# Patient Record
Sex: Female | Born: 1990 | Race: Asian | Hispanic: No | Marital: Single | Smoking: Never smoker
Health system: Southern US, Community
[De-identification: ages and names within clinical notes are randomized; demographics above are authoritative.]

## PROBLEM LIST (undated history)

## (undated) DIAGNOSIS — L83 Acanthosis nigricans: Secondary | ICD-10-CM

## (undated) DIAGNOSIS — R569 Unspecified convulsions: Secondary | ICD-10-CM

## (undated) DIAGNOSIS — L906 Striae atrophicae: Secondary | ICD-10-CM

## (undated) DIAGNOSIS — R768 Other specified abnormal immunological findings in serum: Secondary | ICD-10-CM

## (undated) DIAGNOSIS — N920 Excessive and frequent menstruation with regular cycle: Secondary | ICD-10-CM

## (undated) DIAGNOSIS — S42409A Unspecified fracture of lower end of unspecified humerus, initial encounter for closed fracture: Secondary | ICD-10-CM

## (undated) DIAGNOSIS — G40909 Epilepsy, unspecified, not intractable, without status epilepticus: Secondary | ICD-10-CM

## (undated) DIAGNOSIS — L680 Hirsutism: Secondary | ICD-10-CM

## (undated) DIAGNOSIS — E162 Hypoglycemia, unspecified: Secondary | ICD-10-CM

## (undated) DIAGNOSIS — E161 Other hypoglycemia: Secondary | ICD-10-CM

## (undated) DIAGNOSIS — K219 Gastro-esophageal reflux disease without esophagitis: Secondary | ICD-10-CM

## (undated) DIAGNOSIS — L709 Acne, unspecified: Secondary | ICD-10-CM

## (undated) DIAGNOSIS — M419 Scoliosis, unspecified: Secondary | ICD-10-CM

## (undated) DIAGNOSIS — N912 Amenorrhea, unspecified: Secondary | ICD-10-CM

## (undated) DIAGNOSIS — J45909 Unspecified asthma, uncomplicated: Secondary | ICD-10-CM

## (undated) DIAGNOSIS — R000 Tachycardia, unspecified: Secondary | ICD-10-CM

## (undated) DIAGNOSIS — R6250 Unspecified lack of expected normal physiological development in childhood: Secondary | ICD-10-CM

## (undated) DIAGNOSIS — R040 Epistaxis: Secondary | ICD-10-CM

## (undated) HISTORY — DX: Acne, unspecified: L70.9

## (undated) HISTORY — DX: Unspecified asthma, uncomplicated: J45.909

## (undated) HISTORY — DX: Epistaxis: R04.0

## (undated) HISTORY — DX: Epilepsy, unspecified, not intractable, without status epilepticus: G40.909

## (undated) HISTORY — DX: Other hypoglycemia: E16.1

## (undated) HISTORY — DX: Hirsutism: L68.0

## (undated) HISTORY — DX: Gastro-esophageal reflux disease without esophagitis: K21.9

## (undated) HISTORY — DX: Unspecified lack of expected normal physiological development in childhood: R62.50

## (undated) HISTORY — DX: Amenorrhea, unspecified: N91.2

## (undated) HISTORY — DX: Tachycardia, unspecified: R00.0

## (undated) HISTORY — DX: Other specified abnormal immunological findings in serum: R76.8

## (undated) HISTORY — DX: Excessive and frequent menstruation with regular cycle: N92.0

## (undated) HISTORY — DX: Acanthosis nigricans: L83

## (undated) HISTORY — DX: Unspecified convulsions: R56.9

## (undated) HISTORY — DX: Scoliosis, unspecified: M41.9

## (undated) HISTORY — DX: Striae atrophicae: L90.6

## (undated) HISTORY — DX: Hypoglycemia, unspecified: E16.2

## (undated) HISTORY — DX: Unspecified fracture of lower end of unspecified humerus, initial encounter for closed fracture: S42.409A

---
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
## 2010-06-11 ENCOUNTER — Emergency Department (HOSPITAL_COMMUNITY): Admission: EM | Admit: 2010-06-11 | Discharge: 2010-06-11 | Payer: Self-pay | Admitting: Emergency Medicine

## 2011-04-23 ENCOUNTER — Ambulatory Visit: Payer: Self-pay | Admitting: Internal Medicine

## 2011-06-16 ENCOUNTER — Ambulatory Visit (INDEPENDENT_AMBULATORY_CARE_PROVIDER_SITE_OTHER): Payer: BC Managed Care – PPO | Admitting: Internal Medicine

## 2011-06-16 ENCOUNTER — Encounter: Payer: Self-pay | Admitting: Internal Medicine

## 2011-06-16 VITALS — BP 120/70 | HR 88 | Ht 65.0 in | Wt 145.0 lb

## 2011-06-16 DIAGNOSIS — R894 Abnormal immunological findings in specimens from other organs, systems and tissues: Secondary | ICD-10-CM

## 2011-06-16 DIAGNOSIS — R768 Other specified abnormal immunological findings in serum: Secondary | ICD-10-CM | POA: Insufficient documentation

## 2011-06-16 DIAGNOSIS — E161 Other hypoglycemia: Secondary | ICD-10-CM | POA: Insufficient documentation

## 2011-06-16 DIAGNOSIS — D649 Anemia, unspecified: Secondary | ICD-10-CM | POA: Insufficient documentation

## 2011-06-16 DIAGNOSIS — R7303 Prediabetes: Secondary | ICD-10-CM | POA: Insufficient documentation

## 2011-06-16 DIAGNOSIS — R5383 Other fatigue: Secondary | ICD-10-CM | POA: Insufficient documentation

## 2011-06-16 DIAGNOSIS — R5381 Other malaise: Secondary | ICD-10-CM

## 2011-06-16 DIAGNOSIS — E049 Nontoxic goiter, unspecified: Secondary | ICD-10-CM

## 2011-06-16 DIAGNOSIS — R000 Tachycardia, unspecified: Secondary | ICD-10-CM | POA: Insufficient documentation

## 2011-06-16 DIAGNOSIS — R6250 Unspecified lack of expected normal physiological development in childhood: Secondary | ICD-10-CM | POA: Insufficient documentation

## 2011-06-16 DIAGNOSIS — R7309 Other abnormal glucose: Secondary | ICD-10-CM

## 2011-06-16 DIAGNOSIS — L83 Acanthosis nigricans: Secondary | ICD-10-CM | POA: Insufficient documentation

## 2011-06-16 DIAGNOSIS — M4120 Other idiopathic scoliosis, site unspecified: Secondary | ICD-10-CM

## 2011-06-16 DIAGNOSIS — M419 Scoliosis, unspecified: Secondary | ICD-10-CM | POA: Insufficient documentation

## 2011-06-16 NOTE — Patient Instructions (Signed)
Lab in the am Then plan follow up depending on results.

## 2011-06-16 NOTE — Progress Notes (Signed)
Subjective:    Patient ID: Elizabeth Cantrell), female    DOB: 06-06-1991, 20 y.o.   MRN: [id number]  HPI The patient comes in today with mother for first time visit her previous care was through a retired endocrinology physician Dr. Richardo Hanks at [facility] an endocrinologist.  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 Had seizure at 4 months of age with negative metabolic workup responded to glucose. She was followed by Dr. Philippa Chester at [facility] yearly. She was on medication till about  Age 84 and and off since that time has had no more seizures. She has developmental delay and was getting OT PT and speech therapy at school but now she is doing better so they are only giving her speech therapy. She has limited cognitive ability but enjoys gaining and some music. She has some gait instability and some weakness in the upper extremities poor hand writing and fine motor skills.  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 She had amenorrhea until put on metformin. Was also noted to have a positive ANA but no rheumatologic disease diagnosed. She's had some fatigue in the last year and her previous physician noted a low iron level with normal hemoglobin MCV of 74 iron saturation of 6 of her tests have been normal and reportedly normal thyroid tests.  At this point in time she appears to be clinically stable and developmentally stable according to her mother. She does tend to have frequent falling and has some history of fracture but I do not have those records.   Review of Systems Negative chest pain shortness of breath hearing loss question some vision issues no vomiting blood in stool has about 3 or 4 periods a year. Her acne and body hair  is apparently better in the last few years. No increased weight gain mom states she has a poor appetite.  Sleep is about 6 hours a night has always had restless sleep. No history of snoring obstructive sleep apnea.   She has a history of an elevated heart rate when she walks but cardiology at [facility] felt this was secondary to deconditioning. rset orf ros as per hpi or neg  Past history family history social history reviewed   entered in the electronic medical record.  Positive for Ht Diabetes and elevated lipids    Objective:   Physical Exam WDWN microcephalic in nad cooperative adn resopnsive with  shrot sentence speech.  Eyes PERRLA EOMs difficult to assess ears EACs normal TMs patent normal landmarks Nares patent no discharge OP tonsils +1+2 but good airway no edema Neck supple thyroid is easily palpable but no nodules or tenderness no adenopathy Chest:  Clear to A&P without wheezes rales or rhonchi CV:  S1-S2 no gallops or murmurs peripheral perfusion is normal pulses are intact Abdomen:  Sof,t normal bowel sounds without hepatosplenomegaly, no guarding rebound or masses no CVA tenderness Skin: Small amount of acne on face no significant darkening of the back of her neck. Usual stria abdomen. Neurologic nonfocal weakness hand grip use of upper extremities her gait is somewhat floppy and unsteady and she hyperextends abit .  No focal joint redness or swelling. Hyperreflexic with tight heel cords. 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 Mild goiter felt on exam today.   We'll check lipids A1c blood sugar thyroid half and chemistries and antibodies and a fasting sample and then plan followup will also add a  B12 level and a ferritin as supplementation may BE helpful in her fatigue I don't see any evidence of sleep apnea on multiple questioning..    We will follow her up and she will be followed yearly at [facility] from the neurologic standpoint.

## 2011-06-16 NOTE — Assessment & Plan Note (Signed)
Apparently stable disorder .  Gait distubance and some falling  Hx of seizure as infant with neg metabolic workup.

## 2011-06-17 ENCOUNTER — Other Ambulatory Visit: Payer: BC Managed Care – PPO

## 2011-06-17 LAB — CBC WITH DIFFERENTIAL/PLATELET
Basophils Relative: 0.4 % (ref 0.0–3.0)
Eosinophils Absolute: 0.1 10*3/uL (ref 0.0–0.7)
Hemoglobin: 13.8 g/dL (ref 12.0–15.0)
Lymphocytes Relative: 32.5 % (ref 12.0–46.0)
Lymphs Abs: 3.5 10*3/uL (ref 0.7–4.0)
MCHC: 33.6 g/dL (ref 30.0–36.0)
Neutro Abs: 6.5 10*3/uL (ref 1.4–7.7)
Neutrophils Relative %: 60.8 % (ref 43.0–77.0)
Platelets: 254 10*3/uL (ref 150.0–400.0)

## 2011-06-17 LAB — TSH: TSH: 1.62 u[IU]/mL (ref 0.35–5.50)

## 2011-06-17 LAB — LIPID PANEL
LDL Cholesterol: 44 mg/dL (ref 0–99)
Total CHOL/HDL Ratio: 2

## 2011-06-17 LAB — BASIC METABOLIC PANEL
Creatinine, Ser: 0.6 mg/dL (ref 0.4–1.2)
Glucose, Bld: 81 mg/dL (ref 70–99)
Potassium: 3.8 mEq/L (ref 3.5–5.1)

## 2011-06-17 LAB — VITAMIN B12: Vitamin B-12: 452 pg/mL (ref 211–911)

## 2011-06-17 LAB — T4, FREE: Free T4: 1.01 ng/dL (ref 0.60–1.60)

## 2011-06-18 LAB — THYROID PEROXIDASE ANTIBODY: Thyroperoxidase Ab SerPl-aCnc: 38.5 IU/mL — ABNORMAL HIGH (ref <35.0)

## 2011-06-29 ENCOUNTER — Telehealth: Payer: Self-pay | Admitting: *Deleted

## 2011-06-29 NOTE — Telephone Encounter (Signed)
Left message to call back  

## 2011-06-29 NOTE — Telephone Encounter (Signed)
Message copied by Romualdo Bolk on Tue Jun 29, 2011 11:56 AM ------      Message from: [facility], [place] K      Created: Mon Jun 28, 2011  4:49 PM       Advise mom that labs show borderline low iron levels and normal functioning thyroid but borderline thyroid antibodies present.        This means that her thyroid could begin to function abnormally in future years but is ok now. Will follow labs at least yearly.        Her blood sugar is normal      She may want to take extra iron and see if fatigue is helped .      Recheck  In  4- 6 months to see how she is doing or as needed.

## 2011-07-04 ENCOUNTER — Encounter: Payer: Self-pay | Admitting: Internal Medicine

## 2011-07-04 DIAGNOSIS — E049 Nontoxic goiter, unspecified: Secondary | ICD-10-CM | POA: Insufficient documentation

## 2011-07-04 NOTE — Assessment & Plan Note (Signed)
Get labs r/o dysfunction

## 2011-07-04 NOTE — Assessment & Plan Note (Signed)
Stable seen peds endo at [facility]

## 2011-07-05 NOTE — Telephone Encounter (Signed)
Left message to call back  

## 2011-07-07 NOTE — Telephone Encounter (Signed)
Left message to call back  

## 2011-07-08 NOTE — Telephone Encounter (Signed)
Pts mom aware of results

## 2011-07-22 ENCOUNTER — Telehealth: Payer: Self-pay | Admitting: *Deleted

## 2011-07-22 DIAGNOSIS — M419 Scoliosis, unspecified: Secondary | ICD-10-CM

## 2011-07-22 DIAGNOSIS — D649 Anemia, unspecified: Secondary | ICD-10-CM

## 2011-07-22 DIAGNOSIS — R000 Tachycardia, unspecified: Secondary | ICD-10-CM

## 2011-07-22 DIAGNOSIS — E049 Nontoxic goiter, unspecified: Secondary | ICD-10-CM

## 2011-07-22 DIAGNOSIS — R7303 Prediabetes: Secondary | ICD-10-CM

## 2011-07-22 DIAGNOSIS — R6250 Unspecified lack of expected normal physiological development in childhood: Secondary | ICD-10-CM

## 2011-07-22 DIAGNOSIS — R768 Other specified abnormal immunological findings in serum: Secondary | ICD-10-CM

## 2011-07-22 DIAGNOSIS — R5383 Other fatigue: Secondary | ICD-10-CM

## 2011-07-22 DIAGNOSIS — L83 Acanthosis nigricans: Secondary | ICD-10-CM

## 2011-07-22 MED ORDER — METFORMIN HCL ER 750 MG PO TB24: 750.0000 mg | ORAL_TABLET | Freq: Every day | ORAL | Status: DC

## 2011-07-22 NOTE — Telephone Encounter (Signed)
rx sent to pharmacy

## 2011-08-18 IMAGING — CR DG ELBOW COMPLETE 3+V*L*
5 series · 5 of 5 positions shown · non-contrast
Comparison: None.

CLINICAL DATA: Status post fall; posterior left elbow pain.

LEFT ELBOW - COMPLETE 3+ VIEW

[x elbow joint ap left]
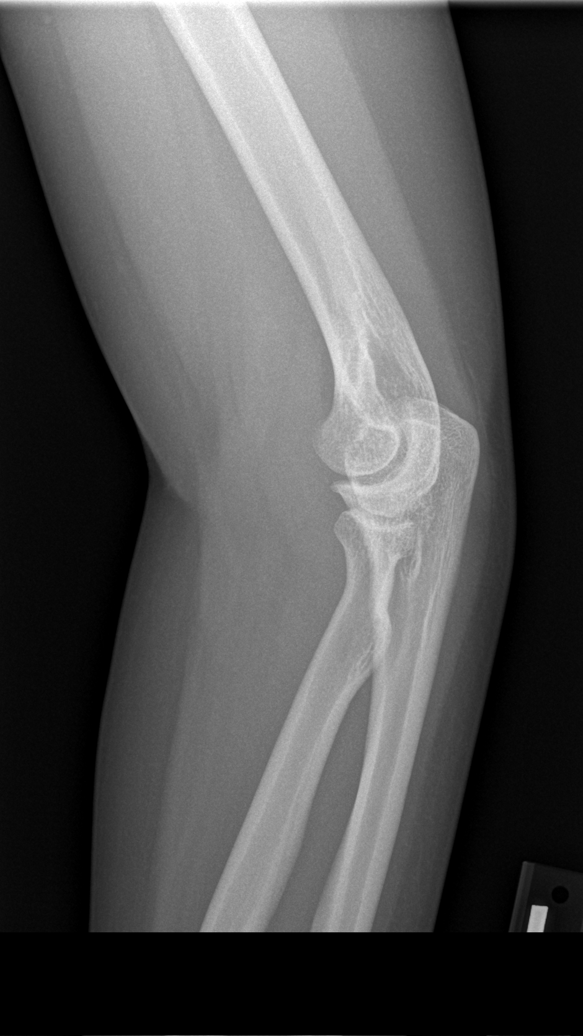

[x elbow joint obl. left (1 of 2)]
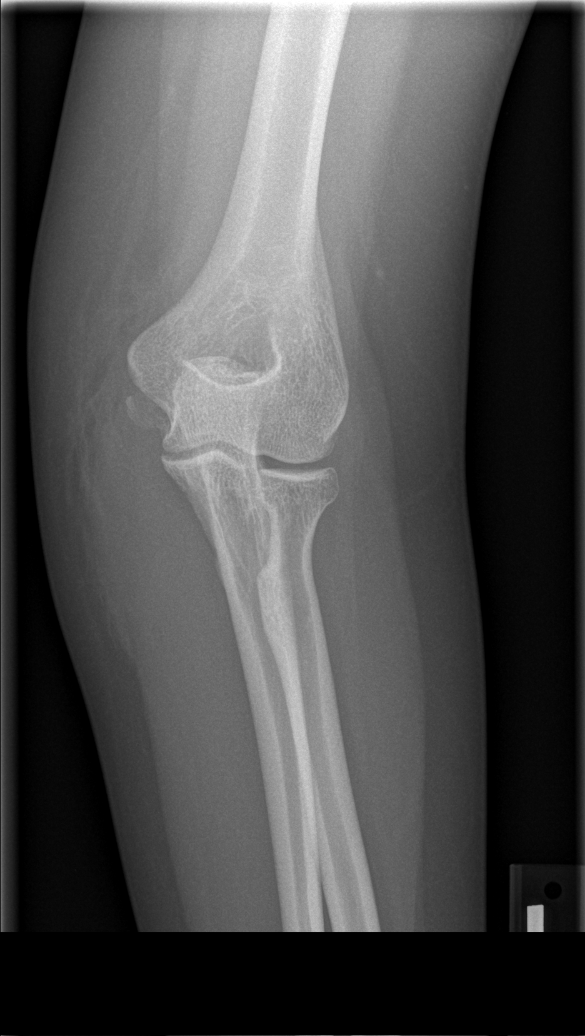

[x elbow joint obl. left (2 of 2)]
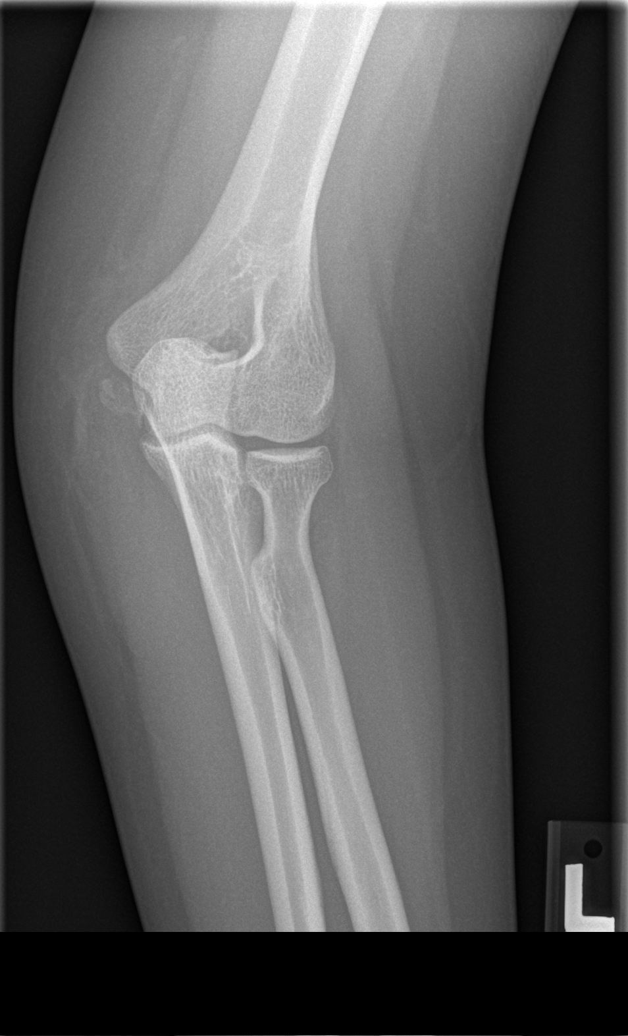

[x elbow joint lat left (1 of 2)]
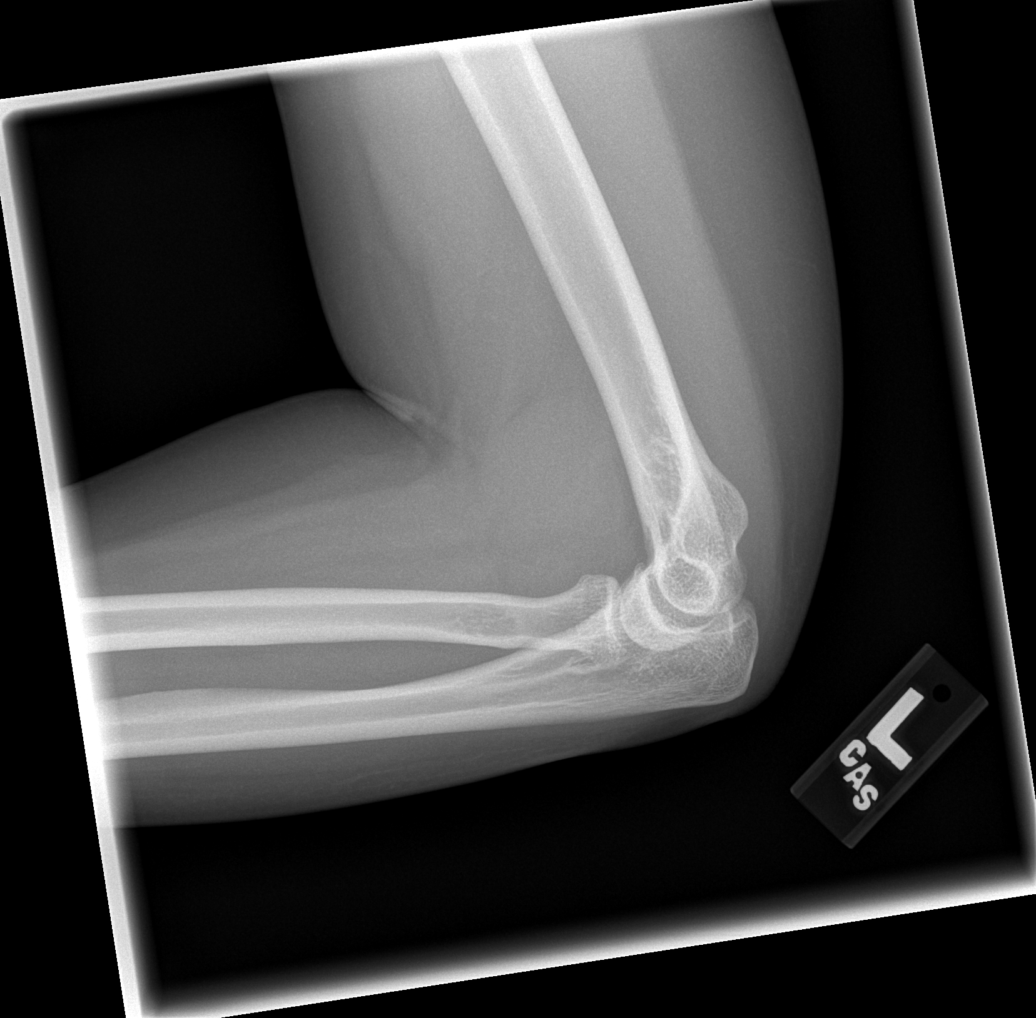

[x elbow joint lat left (2 of 2)]
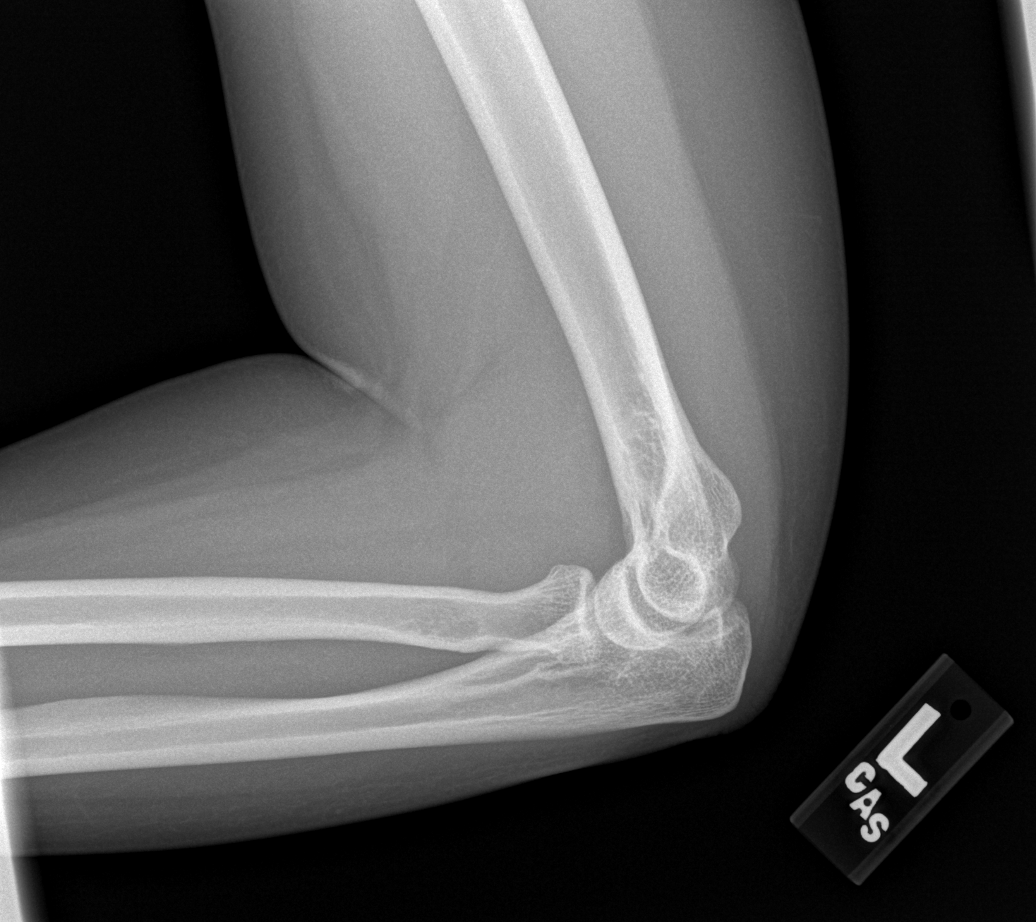

[5 of 5 positions shown; findings below may reference images not displayed]

FINDINGS: An amorphous osseous fragment just distal to the lateral
epicondyle reflects avulsion injury of indeterminate age; this
could be an acute fracture.  The visualized joint spaces are
preserved.  No significant joint effusion is identified.  The soft
tissues are unremarkable in appearance.
IMPRESSION: Avulsion injury from the lateral epicondyle, of indeterminate age;
this could reflect an acute fracture.

## 2012-01-05 ENCOUNTER — Encounter: Payer: Self-pay | Admitting: Internal Medicine

## 2012-01-05 ENCOUNTER — Ambulatory Visit (INDEPENDENT_AMBULATORY_CARE_PROVIDER_SITE_OTHER): Payer: BC Managed Care – PPO | Admitting: Internal Medicine

## 2012-01-05 VITALS — BP 120/80 | HR 84 | Wt 153.0 lb

## 2012-01-05 DIAGNOSIS — R5381 Other malaise: Secondary | ICD-10-CM

## 2012-01-05 DIAGNOSIS — R7309 Other abnormal glucose: Secondary | ICD-10-CM

## 2012-01-05 DIAGNOSIS — L659 Nonscarring hair loss, unspecified: Secondary | ICD-10-CM

## 2012-01-05 DIAGNOSIS — L708 Other acne: Secondary | ICD-10-CM

## 2012-01-05 DIAGNOSIS — R7303 Prediabetes: Secondary | ICD-10-CM

## 2012-01-05 DIAGNOSIS — R5383 Other fatigue: Secondary | ICD-10-CM

## 2012-01-05 DIAGNOSIS — L709 Acne, unspecified: Secondary | ICD-10-CM

## 2012-01-05 DIAGNOSIS — R6250 Unspecified lack of expected normal physiological development in childhood: Secondary | ICD-10-CM

## 2012-01-05 DIAGNOSIS — L83 Acanthosis nigricans: Secondary | ICD-10-CM

## 2012-01-05 NOTE — Patient Instructions (Addendum)
Continue metformin  As you are doing.   exercise as tolerated.   Full set of fasting labs in 6 months and then Wellness check   See dermatologist  Regarding acne  And hair loss on scalp.

## 2012-01-09 DIAGNOSIS — L659 Nonscarring hair loss, unspecified: Secondary | ICD-10-CM | POA: Insufficient documentation

## 2012-01-09 DIAGNOSIS — L709 Acne, unspecified: Secondary | ICD-10-CM | POA: Insufficient documentation

## 2012-01-09 NOTE — Progress Notes (Signed)
  Subjective:    Patient ID: Elizabeth Cantrell), female    DOB: Mar 11, 1991, 21 y.o.   MRN: [id number]  HPI Patient comes in today for follow up of  multiple medical problems.  With mom today No major change in health status since last visit . Doing ok   NEURO may have had a partial seizure left side of body shaking   Alert no LOC  Not on med for seizure at this point. Acne ? What tot do on face . Has creams at home but not using them .  Scalp is also thinning  Top of head  Periods  Ok  Pre diabetes  No sig weight change  Monitoring  Sugars and have been ok.  On metformin without se   .chornic fatigue and ? Exercise intolerance  Where hr elevated's no change felt ot be from deconditioning when evaluated in the past.  No injury or hospitalizations   Review of Systems Neg cp sob bleeding weight changes   Falling    Rest as per hpi   Past history family history social history reviewed in the electronic medical record. Outpatient Prescriptions Prior to Visit  Medication Sig Dispense Refill  . metFORMIN (GLUCOPHAGE-XR) 750 MG 24 hr tablet Take 1 tablet (750 mg total) by mouth daily with breakfast.  30 tablet  5        Objective:   Physical Exam Wd alert in nad cooperative  HEENT  tms clear op tongue midline eoms seem ok  Skin: normal capillary refill ,turgor , color: No acute rashes ,petechiae or bruising  But has scattered acne papules on face  No cystic lesions and some on upper back .   Scalp clear but top of head with thinning hair.  No excess body hair and no stria noted  Neck: Supple without adenopathy or masses or bruits Thyroid palpable non tender Chest:  Clear to A&P without wheezes rales or rhonchi CV:  S1-S2 no gallops or murmurs peripheral perfusion is normal RR rate 82- 88   No clubbing cyanosis or edema Gait ok     Assessment & Plan:  Insulin resistance and pre diabetes  With normal sugars ( check by mom who is diabetic)  Labs had been ok last check    Ok to refill med  today and plan for afull set of labs in 6 months with wellness visit  Acne  adherence to med is an issue so can decide to use or not .Marland Kitchen    Hair thinning may be genetic no androgenic features  Derm may want to get labs if needed  Neuro   Check with  Neuro about  Hx of poss seizure recurrence    No other change in health status

## 2012-01-24 ENCOUNTER — Other Ambulatory Visit: Payer: Self-pay | Admitting: Internal Medicine

## 2012-04-24 ENCOUNTER — Encounter: Payer: Self-pay | Admitting: Internal Medicine

## 2012-04-24 ENCOUNTER — Ambulatory Visit (INDEPENDENT_AMBULATORY_CARE_PROVIDER_SITE_OTHER): Payer: BC Managed Care – PPO | Admitting: Internal Medicine

## 2012-04-24 VITALS — BP 116/76 | HR 90 | Temp 98.0°F | Wt 149.0 lb

## 2012-04-24 DIAGNOSIS — E049 Nontoxic goiter, unspecified: Secondary | ICD-10-CM

## 2012-04-24 DIAGNOSIS — L83 Acanthosis nigricans: Secondary | ICD-10-CM

## 2012-04-24 DIAGNOSIS — R7303 Prediabetes: Secondary | ICD-10-CM

## 2012-04-24 DIAGNOSIS — L659 Nonscarring hair loss, unspecified: Secondary | ICD-10-CM

## 2012-04-24 DIAGNOSIS — R894 Abnormal immunological findings in specimens from other organs, systems and tissues: Secondary | ICD-10-CM

## 2012-04-24 DIAGNOSIS — R6250 Unspecified lack of expected normal physiological development in childhood: Secondary | ICD-10-CM

## 2012-04-24 DIAGNOSIS — R7309 Other abnormal glucose: Secondary | ICD-10-CM

## 2012-04-24 DIAGNOSIS — R768 Other specified abnormal immunological findings in serum: Secondary | ICD-10-CM

## 2012-04-24 DIAGNOSIS — R569 Unspecified convulsions: Secondary | ICD-10-CM

## 2012-04-24 NOTE — Patient Instructions (Addendum)
Plan fasting labs ( can have water) Then we con arrange a endocrinology and neurology   Evaluation .    The hair problem   could be from excess female hormone seen in PCO but check for other causes such as thyroid again.

## 2012-04-28 ENCOUNTER — Other Ambulatory Visit (INDEPENDENT_AMBULATORY_CARE_PROVIDER_SITE_OTHER): Payer: BC Managed Care – PPO

## 2012-04-28 DIAGNOSIS — R768 Other specified abnormal immunological findings in serum: Secondary | ICD-10-CM

## 2012-04-28 DIAGNOSIS — R6250 Unspecified lack of expected normal physiological development in childhood: Secondary | ICD-10-CM

## 2012-04-28 DIAGNOSIS — R894 Abnormal immunological findings in specimens from other organs, systems and tissues: Secondary | ICD-10-CM

## 2012-04-28 DIAGNOSIS — L659 Nonscarring hair loss, unspecified: Secondary | ICD-10-CM

## 2012-04-28 DIAGNOSIS — L83 Acanthosis nigricans: Secondary | ICD-10-CM

## 2012-04-28 DIAGNOSIS — Z0000 Encounter for general adult medical examination without abnormal findings: Secondary | ICD-10-CM

## 2012-04-28 DIAGNOSIS — R7309 Other abnormal glucose: Secondary | ICD-10-CM

## 2012-04-28 DIAGNOSIS — R7303 Prediabetes: Secondary | ICD-10-CM

## 2012-04-28 LAB — CBC WITH DIFFERENTIAL/PLATELET
Basophils Absolute: 0.1 10*3/uL (ref 0.0–0.1)
Hemoglobin: 14.4 g/dL (ref 12.0–15.0)
MCHC: 34.1 g/dL (ref 30.0–36.0)
MCV: 86.5 fl (ref 78.0–100.0)
Neutrophils Relative %: 58 % (ref 43.0–77.0)
RBC: 4.87 Mil/uL (ref 3.87–5.11)
RDW: 15.7 % — ABNORMAL HIGH (ref 11.5–14.6)
WBC: 11.2 10*3/uL — ABNORMAL HIGH (ref 4.5–10.5)

## 2012-04-28 LAB — BASIC METABOLIC PANEL
Creatinine, Ser: 0.7 mg/dL (ref 0.4–1.2)
Potassium: 4 mEq/L (ref 3.5–5.1)
Sodium: 138 mEq/L (ref 135–145)

## 2012-04-28 LAB — HEPATIC FUNCTION PANEL
ALT: 20 U/L (ref 0–35)
Albumin: 3.9 g/dL (ref 3.5–5.2)
Bilirubin, Direct: 0 mg/dL (ref 0.0–0.3)
Total Bilirubin: 0.9 mg/dL (ref 0.3–1.2)
Total Protein: 8.2 g/dL (ref 6.0–8.3)

## 2012-04-28 LAB — LIPID PANEL
Cholesterol: 128 mg/dL (ref 0–200)
HDL: 49 mg/dL (ref >39.00)

## 2012-04-28 LAB — TESTOSTERONE: Testosterone: 48.91 ng/dL (ref 10.00–70.00)

## 2012-04-29 LAB — INSULIN, FASTING: Insulin fasting, serum: 33 u[IU]/mL — ABNORMAL HIGH (ref 3–28)

## 2012-04-30 ENCOUNTER — Encounter: Payer: Self-pay | Admitting: Internal Medicine

## 2012-04-30 NOTE — Progress Notes (Signed)
  Subjective:    Patient ID: Elizabeth Cantrell), female    DOB: 1991-01-06, 21 y.o.   MRN: [id number]  HPI Patient comes in today for follow up of  multiple medical problems.  With mom  Never got labs done  .  Has no local neuro jsut het [facility] eval  ? Cause  Sx with exercise they call weakness and seizure but unsure what this is. Concerning  about hair progressing. Still taking metformin. No se noted .    Review of Systems Neg fever cp sob cough bleeding  Periods "ok"  Hx of elevated HR and poor exercise tolerance.   Past history family history social history reviewed in the electronic medical record. Mom had type 2 dm and hepatic steatosis Outpatient Encounter Prescriptions as of 04/24/2012  Medication Sig Dispense Refill  . metFORMIN (GLUCOPHAGE-XR) 750 MG 24 hr tablet TAKE 1 TABLET BY MOUTH DAILY WITH BREAKFAST  30 tablet  5       Objective:   Physical Exam BP 116/76  Pulse 90  Temp 98 F (36.7 C) (Oral)  Wt 149 lb (67.586 kg)  SpO2 98%  LMP 02/23/2012 WDWN in nad  Interactive  Scalp thinning  Mostly frontal and top of head no scarring alopecia . No excessive body hair  Faint pigment under axilla  No stria. Neck: Supple without adenopathy or masses or bruits thyroid ? Palpable  Chest:  Clear to A&P without wheezes rales or rhonchi CV:  S1-S2 no gallops or murmurs peripheral perfusion is normal Abdomen:  Sof,t normal bowel sounds without hepatosplenomegaly, no guarding rebound or masses no CVA tenderness Neuro cooperative  No tremor      Assessment & Plan:  Hair thinning never had labs done so will arrange this and include testosterone, ana with hx of borderline positive in the past. Didn't see derm ?  See last note.   "seizure like episodes or weakness with some exercise  Evaluated at [facility] ? Cause  dont have these records today. There are 2 notes from PEDs endo 2011     See if we can get local neuro to see with peds neuro such as Dr Sharene Skeans to see her .

## 2012-05-01 LAB — DHEA-SULFATE: DHEA-SO4: 152 ug/dL (ref 35–430)

## 2012-05-01 LAB — ANTI-NUCLEAR AB-TITER (ANA TITER): ANA Titer 1: NEGATIVE

## 2012-05-01 LAB — ANA: Anti Nuclear Antibody(ANA): POSITIVE — AB

## 2012-05-09 ENCOUNTER — Other Ambulatory Visit: Payer: Self-pay | Admitting: Family Medicine

## 2012-05-09 DIAGNOSIS — L659 Nonscarring hair loss, unspecified: Secondary | ICD-10-CM

## 2012-05-17 ENCOUNTER — Other Ambulatory Visit: Payer: Self-pay | Admitting: Family Medicine

## 2012-05-17 DIAGNOSIS — R569 Unspecified convulsions: Secondary | ICD-10-CM

## 2012-05-18 NOTE — Progress Notes (Signed)
Left message on voicemail for the pt to return my call. 

## 2012-08-23 ENCOUNTER — Other Ambulatory Visit: Payer: Self-pay | Admitting: Internal Medicine

## 2012-08-25 ENCOUNTER — Encounter: Payer: Self-pay | Admitting: Internal Medicine

## 2012-09-26 ENCOUNTER — Other Ambulatory Visit: Payer: Self-pay | Admitting: Internal Medicine

## 2013-04-09 ENCOUNTER — Other Ambulatory Visit: Payer: Self-pay | Admitting: Internal Medicine

## 2013-05-30 ENCOUNTER — Encounter: Payer: Self-pay | Admitting: Internal Medicine

## 2013-05-30 ENCOUNTER — Ambulatory Visit (INDEPENDENT_AMBULATORY_CARE_PROVIDER_SITE_OTHER): Payer: BC Managed Care – PPO | Admitting: Internal Medicine

## 2013-05-30 VITALS — BP 100/70 | HR 84 | Temp 97.4°F | Ht 65.0 in | Wt 151.0 lb

## 2013-05-30 DIAGNOSIS — R6250 Unspecified lack of expected normal physiological development in childhood: Secondary | ICD-10-CM

## 2013-05-30 DIAGNOSIS — Z23 Encounter for immunization: Secondary | ICD-10-CM

## 2013-05-30 DIAGNOSIS — R7309 Other abnormal glucose: Secondary | ICD-10-CM

## 2013-05-30 DIAGNOSIS — E611 Iron deficiency: Secondary | ICD-10-CM | POA: Insufficient documentation

## 2013-05-30 DIAGNOSIS — R7303 Prediabetes: Secondary | ICD-10-CM

## 2013-05-30 DIAGNOSIS — Z0000 Encounter for general adult medical examination without abnormal findings: Secondary | ICD-10-CM | POA: Insufficient documentation

## 2013-05-30 DIAGNOSIS — L659 Nonscarring hair loss, unspecified: Secondary | ICD-10-CM

## 2013-05-30 DIAGNOSIS — D509 Iron deficiency anemia, unspecified: Secondary | ICD-10-CM

## 2013-05-30 LAB — CBC WITH DIFFERENTIAL/PLATELET
Eosinophils Relative: 0.6 % (ref 0.0–5.0)
HCT: 44.2 % (ref 36.0–46.0)
Hemoglobin: 14.6 g/dL (ref 12.0–15.0)
Lymphs Abs: 2.8 10*3/uL (ref 0.7–4.0)
Monocytes Relative: 6.6 % (ref 3.0–12.0)
Platelets: 278 10*3/uL (ref 150.0–400.0)
WBC: 10.4 10*3/uL (ref 4.5–10.5)

## 2013-05-30 LAB — BASIC METABOLIC PANEL
BUN: 10 mg/dL (ref 6–23)
GFR: 125.24 mL/min (ref >60.00)
Potassium: 4.1 mEq/L (ref 3.5–5.1)
Sodium: 136 mEq/L (ref 135–145)

## 2013-05-30 LAB — LIPID PANEL
Cholesterol: 125 mg/dL (ref 0–200)
HDL: 40.3 mg/dL (ref >39.00)
Triglycerides: 202 mg/dL — ABNORMAL HIGH (ref 0.0–149.0)
VLDL: 40.4 mg/dL — ABNORMAL HIGH (ref 0.0–40.0)

## 2013-05-30 LAB — T4, FREE: Free T4: 1.02 ng/dL (ref 0.60–1.60)

## 2013-05-30 LAB — HEMOGLOBIN A1C: Hgb A1c MFr Bld: 4.1 % — ABNORMAL LOW (ref 4.6–6.5)

## 2013-05-30 LAB — TSH: TSH: 0.29 u[IU]/mL — ABNORMAL LOW (ref 0.35–5.50)

## 2013-05-30 NOTE — Progress Notes (Signed)
Chief Complaint  Patient presents with  . Annual Exam    here with mom     HPI: Patient comes in today for Preventive Health Care visit  Since her last visit she has done well she seen a neurologist he states that her seizures are resolved and does need to go back except as needed Also has seen the endocrinologist and the dermatologist for thinning hair lab tests apparently were okay except for slightly lowered iron level. No recommendations. Only medication is metformin. At this time here seems to be stable. Her periods  better about every 40 days. Mom think she's doing better  She goes to school and get some exercise with walking morning and evening at home. She complained of some ringing in her ears to mom looked in there and thought she had a hair in her ear or water. No hearing deficit concerns no falling or dizziness ROS:  GEN/ HEENT: No fever, significant weight changes sweats headaches vision problems hearing changes, CV/ PULM; No chest pain shortness of breath cough, syncope,edema  change in exercise tolerance. GI /GU: No adominal pain, vomiting, change in bowel habits. No blood in the stool. No significant GU symptoms. SKIN/HEME: ,no acute skin rashes suspicious lesions or bleeding. No lymphadenopathy, nodules, masses.  NEURO/ PSYCH:  No neurologic signs such as weakness numbness. No depression anxiety. IMM/ Allergy: No unusual infections.  Allergy .   REST of 12 system review negative except as per HPI   Past Medical History  Diagnosis Date  . Seizure disorder   . Acquired acanthosis nigricans   . Hyperinsulinism   . 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 Testing for Rett Syndrome normal.  . Hypoglycemia     with seizures that seem to respond to glucose  . Hirsutism     with elevated free testosterone levels  . Acne   . Striae   . Tachycardia   . Heavy periods   . Nosebleed     with abnormal clotting  studies  . Amenorrhea     until beginning glucophage  . ANA positive   . 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 Fractured elbow     x 2 with falls    Family History  Problem Relation Age of Onset  . Diabetes Mother   . Hypertension Mother   . Hyperlipidemia      parent grandparent  History reviewed. No pertinent past surgical history.   History   Social History  . Marital Status: Married    Spouse Name: N/A    Number of Children: N/A  . Years of Education: N/A   Social History Main Topics  . Smoking status: Never Smoker   . Smokeless tobacco: None  . Alcohol Use: No  . Drug Use: No  . Sexually Active: None   Other Topics Concern  . None   Social History Narrative   hh of  3   [employer]  [name] gets    Neg tad   6 hours sleep gets speech therapy at school ( no ot pt)    Outpatient Encounter Prescriptions as of 05/30/2013  Medication Sig Dispense Refill  . metFORMIN (GLUCOPHAGE-XR) 750 MG 24 hr tablet TAKE 1 TABLET BY MOUTH DAILY WITH BREAKFAST  30 tablet  5  . metFORMIN (GLUCOPHAGE-XR) 750 MG 24 hr tablet TAKE 1 TABLET BY MOUTH  DAILY WITH BREAKFAST  90 tablet  0   No facility-administered encounter medications on file as of 05/30/2013.    EXAM:  BP 100/70  Pulse 84  Temp(Src) 97.4 F (36.3 C) (Oral)  Ht 5\' 5"  (1.651 m)  Wt 151 lb (68.493 kg)  BMI 25.13 kg/m2  SpO2 98%  LMP 04/25/2013  Body mass index is 25.13 kg/(m^2).  Physical Exam: Vital signs reviewed ZOX:WRUE is a well-developed well-nourished alert cooperative   female who appears her stated age in no acute distress. Here with mom cooperative HEENT: normocephalic atraumatic , Eyes: PERRL EOM's are to elicit and follow conjunctiva clear, Nares: paten,t no deformity discharge or tenderness., Ears: no deformity EAC's clear TMs with normal landmarks. There is a small amount of wax in both ears but I don't see a foreign body. Mouth: clear  OP, no lesions, edema.  Moist mucous membranes. Dentition in adequate repair. NECK: supple without masses,or bruits. No adenopathy CHEST/PULM:  Clear to auscultation and percussion breath sounds equal no wheeze , rales or rhonchi. No chest wall deformities or tenderness. Breast: normal by inspection . No dimpling, discharge, masses, tenderness or discharge . Tanner 5 CV: PMI is nondisplaced, S1 S2 no gallops, murmurs, rubs. Peripheral pulses are full without delay.No JVD .  ABDOMEN: Bowel sounds normal nontender  No guard or rebound, no hepato splenomegal no CVA tenderness.  No hernia. Extremtities:  No clubbing cyanosis or edema, no acute joint swelling or redness no focal atrophy NEURO:  Oriented x3, cranial nerves 3-12 appear to be intact, no obvious focal weakness,gait within normal limit brisk lower extremity reflexes slight distal atrophy? SKIN: No acute rashes normal turgor, color, no bruising or petechiae. They did acne on the back 2 areas of scars mom says sounds like a keloid. Plan is to observe   Hair thinnin on top of head.  Midline hair lower abdomen  No acute striae LN: no cervical axillary inguinal adenopathy Screening ortho / MS exam: normal; mild  scoliosis ,LOM , joint swelling or gait disturbance . ?  Lab Results  Component Value Date   WBC 11.2* 04/28/2012   HGB 14.4 04/28/2012   HCT 42.1 04/28/2012   PLT 253.0 04/28/2012   GLUCOSE 85 04/28/2012   CHOL 128 04/28/2012   TRIG 75.0 04/28/2012   HDL 49.00 04/28/2012   LDLCALC 64 04/28/2012   ALT 20 04/28/2012   AST 12 04/28/2012   NA 138 04/28/2012   K 4.0 04/28/2012   CL 105 04/28/2012   CREATININE 0.7 04/28/2012   BUN 8 04/28/2012   CO2 25 04/28/2012   TSH 3.35 04/28/2012   HGBA1C 4.1* 04/28/2012    ASSESSMENT AND PLAN:  Discussed the following assessment and plan:  Visit for preventive health examination - Plan: Basic metabolic panel, CBC with Differential, Hemoglobin A1c, Lipid panel, TSH, T4, free, Ferritin, Tdap vaccine greater than or equal  to 7yo IM  Prediabetes insulin resistance  - good tolerance of meds  - Plan: Basic metabolic panel, CBC with Differential, Hemoglobin A1c, Lipid panel, TSH, T4, free, Ferritin  Hair thinning - top of head   no progressino of body hair  - Plan: Basic metabolic panel, CBC with Differential, Hemoglobin A1c, Lipid panel, TSH, T4, free, Ferritin  Iron deficiency - Plan: Basic metabolic panel, CBC with Differential, Hemoglobin A1c, Lipid panel, TSH, T4, free, Ferritin  Need for prophylactic vaccination with combined diphtheria-tetanus-pertussis (DTP) vaccine - Plan: Tdap vaccine greater than or equal to 7yo IM  Developmental delay disorder  Question of bringing in ears comes and goes try wax softening drops I do not see impacted wax we could hear date if persistent. Discussed with mom her metabolic tendency. She may fit the criteria for PCO OS or metabolic syndrome as it used to be called. She has a history of some insulin resistance has some elevated testosterone she may benefit for hormonal therapy such as OCPs. Mom is unsure she's interested in this and at this time. She's got reminders that her child to be getting a Pap smear however mom says she is virginal and doesn't really think she needs a Pap. I agree but a gynecologic consult possible pelvic exam may be helpful in management. We'll think about it consider next year. Check routine labs today to include her glucose and lipids Patient Care Team: Madelin Headings, MD as PCP - General (Internal Medicine) Talmage Coin, MD as Attending Physician (Endocrinology) Patient Instructions  Will notify you  of labs when available. Encourage  Exercise and acitivy as you are doing and healthy diet to prevent diabetes.  I agree  Pap test may net need to be done  but consider getting  Adequate GYNE pelvic exam  With her hx of irreg menses and insulin resistance . Sometimes  Hormonal therapy ie ( OCPS) is also helpful .   We can do this next year or if  decide in meantime .    Try debrox or murine softening drop sin ear at night and see if this helps her sx.   ROV in  12 months or as needed     Neta Mends. Audwin Semper M.D.  Health Maintenance  Topic Date Due  . Pap Smear  02/06/2009  . Influenza Vaccine  06/25/2013  . Tetanus/tdap  05/31/2023   Health Maintenance Review

## 2013-05-30 NOTE — Patient Instructions (Signed)
Will notify you  of labs when available. Encourage  Exercise and acitivy as you are doing and healthy diet to prevent diabetes.  I agree  Pap test may net need to be done  but consider getting  Adequate GYNE pelvic exam  With her hx of irreg menses and insulin resistance . Sometimes  Hormonal therapy ie ( OCPS) is also helpful .   We can do this next year or if decide in meantime .    Try debrox or murine softening drop sin ear at night and see if this helps her sx.   ROV in  12 months or as needed

## 2013-05-31 LAB — LDL CHOLESTEROL, DIRECT: Direct LDL: 66.8 mg/dL

## 2013-06-15 ENCOUNTER — Encounter: Payer: Self-pay | Admitting: Internal Medicine

## 2013-07-09 ENCOUNTER — Other Ambulatory Visit: Payer: Self-pay | Admitting: Internal Medicine

## 2013-07-16 ENCOUNTER — Other Ambulatory Visit (INDEPENDENT_AMBULATORY_CARE_PROVIDER_SITE_OTHER): Payer: BC Managed Care – PPO

## 2013-07-16 DIAGNOSIS — E039 Hypothyroidism, unspecified: Secondary | ICD-10-CM

## 2013-07-16 LAB — T3, FREE: T3, Free: 2.7 pg/mL (ref 2.3–4.2)

## 2013-07-19 ENCOUNTER — Encounter: Payer: Self-pay | Admitting: Family Medicine

## 2013-07-20 ENCOUNTER — Encounter: Payer: Self-pay | Admitting: Internal Medicine

## 2013-08-14 ENCOUNTER — Ambulatory Visit: Payer: BC Managed Care – PPO

## 2013-08-17 ENCOUNTER — Ambulatory Visit (INDEPENDENT_AMBULATORY_CARE_PROVIDER_SITE_OTHER): Payer: BC Managed Care – PPO

## 2013-08-17 DIAGNOSIS — Z23 Encounter for immunization: Secondary | ICD-10-CM

## 2013-09-25 ENCOUNTER — Other Ambulatory Visit: Payer: Self-pay | Admitting: Internal Medicine

## 2013-12-05 ENCOUNTER — Encounter: Payer: Self-pay | Admitting: Diagnostic Neuroimaging

## 2013-12-05 ENCOUNTER — Encounter (INDEPENDENT_AMBULATORY_CARE_PROVIDER_SITE_OTHER): Payer: Self-pay

## 2013-12-05 ENCOUNTER — Ambulatory Visit (INDEPENDENT_AMBULATORY_CARE_PROVIDER_SITE_OTHER): Payer: BC Managed Care – PPO | Admitting: Diagnostic Neuroimaging

## 2013-12-05 VITALS — BP 116/79 | HR 85 | Temp 97.4°F | Ht 63.0 in | Wt 157.0 lb

## 2013-12-05 DIAGNOSIS — R6250 Unspecified lack of expected normal physiological development in childhood: Secondary | ICD-10-CM

## 2013-12-05 DIAGNOSIS — G40909 Epilepsy, unspecified, not intractable, without status epilepticus: Secondary | ICD-10-CM

## 2013-12-05 NOTE — Progress Notes (Signed)
[facility]  PATIENT: Elizabeth Cantrell DOB: [birth year]  REFERRING CLINICIAN:  HISTORY FROM: patient and mother  REASON FOR VISIT: follow up   HISTORICAL  CHIEF COMPLAINT:  Chief Complaint  Patient presents with  . Follow-up    sz    HISTORY OF PRESENT ILLNESS:   UPDATE 12/05/13: Since last visit, continues to have "minor seizures" consisting of drop attacks with postictal confusion. She has 4-5 days per year and takes 24 hours to fully recover. He seizures seem to occur when patient is traveling or sleep deprived. No generalized convulsive seizures. Patient also having difficulty with sleep, difficulty falling and staying asleep. She typically wakes up intermittently throughout the night, finally waking up around 9 in the morning. She also takes naps in the daytime because of excessive sleepiness. No snoring or apnea.  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 No other complications. Patient was born by spontaneous vaginal delivery with no complications. The first year of her life her omental milestones were delayed. Patient denies bowel to 166 months old. She did not sit up until 89-year-old. She never rolled over a one to crawl. She started to walk around 23 years old. She has difficulty climbing stairs. She has significant speech delay did not speak at all to age 23 months old. At age 33 or so she started speaking in sentences. She had impaired social interaction, poor eye contact and motor skills throughout her early childhood.  Brandi months old she was noted to have roving eye movements. Around 699 months old she had  generalized convulsive seizures. She is placed on Depakote initially, then switched to Tegretol after [place] in [place]. Patient continues to have seizures until age 23 years old. Her grand mal convulsive seizures then subsided and patient was taken off of Tegretol.  Since that time she's had a different type of episode consisting of sudden onset imbalance, falling down to either the right or left side. This is followed by either right or left sided paralysis. Reports of confusion, staring have been described at patient's mother and bystanders. Having 1-3 episodes per year. Her last evaluation at [facility] in 2010 is the possibility that these episodes were not definitely seizures. She's not been restarted on anticonvulsant therapy.   REVIEW OF SYSTEMS: Full 14 system review of systems performed and notable only for fatigue anemia is still vision loss of vision shortness of breath palpitations him it because it is joint pain aching muscles back pain seizures difficulty speaking weakness tremor passing out memory loss dizziness headache easy bleeding.  ALLERGIES: No Known Allergies  HOME MEDICATIONS: Outpatient Prescriptions Prior to Visit  Medication Sig Dispense Refill  . metFORMIN (GLUCOPHAGE-XR) 750 MG 24 hr tablet TAKE 1 TABLET BY MOUTH DAILY WITH BREAKFAST  30 tablet  5  . metFORMIN (GLUCOPHAGE-XR) 750 MG 24 hr tablet TAKE 1 TABLET BY MOUTH DAILY WITH BREAKFAST  90 tablet  0  . metFORMIN (GLUCOPHAGE-XR) 750 MG 24 hr tablet TAKE 1 TABLET BY MOUTH DAILY WITH BREAKFAST  30 tablet  5  . metFORMIN (GLUCOPHAGE-XR) 750 MG 24 hr tablet TAKE 1 TABLET BY MOUTH DAILY WITH BREAKFAST  30 tablet  5   No facility-administered medications prior to visit.  PAST MEDICAL HISTORY: Past Medical History  Diagnosis Date  . Seizure disorder   . Acquired acanthosis nigricans   . Hyperinsulinism   . 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 Testing for Rett Syndrome normal.  . Hypoglycemia     with seizures that seem to respond to glucose  . Hirsutism     with elevated free testosterone levels  . Acne   . Striae   . Tachycardia   . Heavy periods   . Nosebleed     with abnormal clotting studies  . Amenorrhea     until beginning glucophage  . ANA positive   . 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 Fractured elbow     x 2 with falls    PAST SURGICAL HISTORY: History reviewed. No pertinent past surgical history.  FAMILY HISTORY: Family History  Problem Relation Age of Onset  . Diabetes Mother   . Hypertension Mother   . Hyperlipidemia      parent grandparent    SOCIAL HISTORY:  History   Social History  . Marital Status: Married    Spouse Name: N/A    Number of Children: N/A  . Years of Education: 11th   Occupational History  . Not on file.   Social History Main Topics  . Smoking status: Never Smoker   . Smokeless tobacco: Never Used  . Alcohol Use: No  . Drug Use: No  . Sexual Activity: Not on file   Other Topics Concern  . Not on file   Social History Narrative   hh of  3   [employer]  [name] gets    Neg tad   6 hours sleep gets speech therapy at school ( no ot pt)   Patient lives at home with her parents.   Caffeine Use: none     PHYSICAL EXAM  Filed Vitals:   12/05/13 1216  BP: 116/79  Pulse: 85  Temp: 97.4 F (36.3 C)  TempSrc: Oral  Height: 5\' 3"  (1.6 m)  Weight: 157 lb (71.215 kg)    Not recorded    Body mass index is 27.82 kg/(m^2).  GENERAL EXAM: Patient is in no distress; well developed, nourished and groomed; neck is supple  CARDIOVASCULAR: Regular rate and rhythm, no murmurs, no carotid bruits  NEUROLOGIC: MENTAL STATUS: awake, alert; DECR FLUENCY; COMPREHENSION INTACT. SHORT SENTENCES. CRANIAL NERVE: no papilledema on fundoscopic exam, pupils equal and reactive to  light, visual fields full to confrontation, extraocular muscles intact, no nystagmus, facial sensation and strength symmetric, hearing intact, palate elevates symmetrically, uvula midline, shoulder shrug symmetric, tongue midline. MOTOR: INCREASED TONE IN BUE AND BLE. SYMM 4-5 STRENGTH IN BUE AND BLE.  SENSORY: normal and symmetric to light touch COORDINATION: finger-nose-finger, fine finger movements SLOW REFLEXES: deep tendon reflexes present and symmetric GAIT/STATION: narrow based gait; SPASTIC GAIT.    DIAGNOSTIC DATA (LABS, IMAGING, TESTING) - I reviewed patient records, labs, notes, testing and imaging myself where available.  Lab Results  Component Value Date   WBC 10.4 05/30/2013   HGB 14.6 05/30/2013   HCT 44.2 05/30/2013   MCV 91.6 05/30/2013   PLT 278.0 05/30/2013      Component Value Date/Time   NA 136 05/30/2013 1252   K 4.1 05/30/2013 1252   CL 102 05/30/2013 1252   CO2 26 05/30/2013 1252   GLUCOSE 78  05/30/2013 1252   BUN 10 05/30/2013 1252   CREATININE 0.6 05/30/2013 1252   CALCIUM 9.5 05/30/2013 1252   PROT 8.2 04/28/2012 0827   ALBUMIN 3.9 04/28/2012 0827   AST 12 04/28/2012 0827   ALT 20 04/28/2012 0827   ALKPHOS 78 04/28/2012 0827   BILITOT 0.9 04/28/2012 0827   Lab Results  Component Value Date   CHOL 125 05/30/2013   HDL 40.30 05/30/2013   LDLCALC 64 04/28/2012   LDLDIRECT 66.8 05/30/2013   TRIG 202.0* 05/30/2013   CHOLHDL 3 05/30/2013   Lab Results  Component Value Date   HGBA1C 4.1* 05/30/2013   Lab Results  Component Value Date   VITAMINB12 452 06/17/2011   Lab Results  Component Value Date   TSH 1.59 07/16/2013     ASSESSMENT AND PLAN  23 y.o. female with history of developmental delay, seizures, spastic gait. She's had extensive testing at [facility] over the past 2 years. Continues episodes of falling down, confusion may represent ongoing partial seizures, but is not quite clear. She is having 4-5 episodes per year. No convulsive seizures.  PLAN: 1. EEG 2. Then consider  trial of levetiracetam, lamotrigine or Vimpat   Orders Placed This Encounter  Procedures  . EEG adult    Return in about 6 months (around 06/04/2014) for with Heide Guile or Penumalli.    Suanne Marker, MD 12/05/2013, 1:23 PM Certified in Neurology, Neurophysiology and Neuroimaging  [facility] [address], [address] [place], [place] [address] [phone]

## 2013-12-10 ENCOUNTER — Other Ambulatory Visit: Payer: BC Managed Care – PPO | Admitting: Radiology

## 2013-12-19 ENCOUNTER — Ambulatory Visit (INDEPENDENT_AMBULATORY_CARE_PROVIDER_SITE_OTHER): Payer: BC Managed Care – PPO

## 2013-12-19 DIAGNOSIS — G40909 Epilepsy, unspecified, not intractable, without status epilepticus: Secondary | ICD-10-CM

## 2013-12-19 DIAGNOSIS — R6250 Unspecified lack of expected normal physiological development in childhood: Secondary | ICD-10-CM

## 2013-12-28 NOTE — Procedures (Signed)
   [facility]  EEG (ELECTROENCEPHALOGRAM) REPORT   STUDY DATE: 12/19/13 PATIENT NAME: Elizabeth Cantrell DOB: [birth year] MRN: [id number]  ORDERING CLINICIAN: Joycelyn SchmidVikram Hortensia Duffin, MD   TECHNOLOGIST: Gearldine ShownLorraine Jones TECHNIQUE: Electroencephalogram was recorded utilizing standard 10-20 system of lead placement and reformatted into average and bipolar montages.  RECORDING TIME: 30 minutes ACTIVATION: hyperventilation and photic stimulation  CLINICAL INFORMATION: 23 year old female with developmental delay and seizures.  FINDINGS: Background rhythms of 9-10 hertz and 50-60 microvolts. No focal, lateralizing, epileptiform activity or seizures are seen. Patient recorded in the awake state. EKG channel shows 72 beats per minute.  IMPRESSION:  Normal EEG in the awake state.   INTERPRETING PHYSICIAN:  Suanne MarkerVIKRAM R. Taira Knabe, MD Certified in Neurology, Neurophysiology and Neuroimaging  [facility] [address], [address] [place], [place] [id number] [phone]

## 2014-04-30 ENCOUNTER — Other Ambulatory Visit: Payer: Self-pay | Admitting: Internal Medicine

## 2014-05-02 NOTE — Telephone Encounter (Signed)
Ok to refill through October   I think she has  seeing dr Sharl MaKerr  Endo or other endo  Please ask mom about this

## 2014-05-02 NOTE — Telephone Encounter (Signed)
Pt not seen since 05/2013.  Has a CPE appt for Oct.  Should appt be moved so she can be seen sooner?

## 2014-05-02 NOTE — Telephone Encounter (Signed)
Sent to the pharmacy by e-scribe. 

## 2014-06-10 ENCOUNTER — Ambulatory Visit: Payer: BC Managed Care – PPO | Admitting: Diagnostic Neuroimaging

## 2014-08-07 ENCOUNTER — Other Ambulatory Visit (INDEPENDENT_AMBULATORY_CARE_PROVIDER_SITE_OTHER): Payer: Self-pay

## 2014-08-07 DIAGNOSIS — Z0000 Encounter for general adult medical examination without abnormal findings: Secondary | ICD-10-CM

## 2014-08-07 LAB — HEPATIC FUNCTION PANEL
ALBUMIN: 3.6 g/dL (ref 3.5–5.2)
ALT: 20 U/L (ref 0–35)
AST: 14 U/L (ref 0–37)
Alkaline Phosphatase: 75 U/L (ref 39–117)
Bilirubin, Direct: 0 mg/dL (ref 0.0–0.3)
Total Bilirubin: 0.9 mg/dL (ref 0.2–1.2)
Total Protein: 8.6 g/dL — ABNORMAL HIGH (ref 6.0–8.3)

## 2014-08-07 LAB — CBC WITH DIFFERENTIAL/PLATELET
Basophils Absolute: 0.1 10*3/uL (ref 0.0–0.1)
Basophils Relative: 0.4 % (ref 0.0–3.0)
EOS PCT: 1 % (ref 0.0–5.0)
Eosinophils Absolute: 0.1 10*3/uL (ref 0.0–0.7)
HEMATOCRIT: 42.8 % (ref 36.0–46.0)
HEMOGLOBIN: 14.6 g/dL (ref 12.0–15.0)
LYMPHS ABS: 3.6 10*3/uL (ref 0.7–4.0)
Lymphocytes Relative: 29.8 % (ref 12.0–46.0)
MCHC: 34.1 g/dL (ref 30.0–36.0)
MCV: 88.3 fl (ref 78.0–100.0)
Monocytes Absolute: 0.5 10*3/uL (ref 0.1–1.0)
Monocytes Relative: 4 % (ref 3.0–12.0)
NEUTROS ABS: 7.9 10*3/uL — AB (ref 1.4–7.7)
Neutrophils Relative %: 64.8 % (ref 43.0–77.0)
Platelets: 274 10*3/uL (ref 150.0–400.0)
RBC: 4.84 Mil/uL (ref 3.87–5.11)
RDW: 14.2 % (ref 11.5–15.5)
WBC: 12.2 10*3/uL — AB (ref 4.0–10.5)

## 2014-08-07 LAB — BASIC METABOLIC PANEL
BUN: 7 mg/dL (ref 6–23)
CO2: 30 mEq/L (ref 19–32)
Calcium: 9.5 mg/dL (ref 8.4–10.5)
Chloride: 102 mEq/L (ref 96–112)
Creatinine, Ser: 0.7 mg/dL (ref 0.4–1.2)
GFR: 109.74 mL/min (ref >60.00)
GLUCOSE: 77 mg/dL (ref 70–99)
Potassium: 4.3 mEq/L (ref 3.5–5.1)
SODIUM: 138 meq/L (ref 135–145)

## 2014-08-07 LAB — LIPID PANEL
CHOL/HDL RATIO: 3
Cholesterol: 126 mg/dL (ref 0–200)
HDL: 39.3 mg/dL (ref >39.00)
LDL Cholesterol: 59 mg/dL (ref 0–99)
NONHDL: 86.7
TRIGLYCERIDES: 137 mg/dL (ref 0.0–149.0)
VLDL: 27.4 mg/dL (ref 0.0–40.0)

## 2014-08-07 LAB — TSH: TSH: 0.64 u[IU]/mL (ref 0.35–4.50)

## 2014-08-14 ENCOUNTER — Encounter: Payer: BC Managed Care – PPO | Admitting: Internal Medicine

## 2014-08-14 ENCOUNTER — Encounter: Payer: Self-pay | Admitting: Internal Medicine

## 2014-08-14 ENCOUNTER — Ambulatory Visit (INDEPENDENT_AMBULATORY_CARE_PROVIDER_SITE_OTHER): Payer: BC Managed Care – PPO | Admitting: Internal Medicine

## 2014-08-14 VITALS — BP 110/70 | Temp 98.5°F | Ht 65.0 in | Wt 144.0 lb

## 2014-08-14 DIAGNOSIS — R7303 Prediabetes: Secondary | ICD-10-CM

## 2014-08-14 DIAGNOSIS — R7309 Other abnormal glucose: Secondary | ICD-10-CM

## 2014-08-14 DIAGNOSIS — E611 Iron deficiency: Secondary | ICD-10-CM

## 2014-08-14 DIAGNOSIS — E282 Polycystic ovarian syndrome: Secondary | ICD-10-CM

## 2014-08-14 DIAGNOSIS — E063 Autoimmune thyroiditis: Secondary | ICD-10-CM | POA: Insufficient documentation

## 2014-08-14 DIAGNOSIS — R6250 Unspecified lack of expected normal physiological development in childhood: Secondary | ICD-10-CM

## 2014-08-14 DIAGNOSIS — Z23 Encounter for immunization: Secondary | ICD-10-CM

## 2014-08-14 DIAGNOSIS — Z0000 Encounter for general adult medical examination without abnormal findings: Secondary | ICD-10-CM

## 2014-08-14 DIAGNOSIS — L659 Nonscarring hair loss, unspecified: Secondary | ICD-10-CM

## 2014-08-14 LAB — IBC PANEL
Iron: 59 ug/dL (ref 42–145)
Saturation Ratios: 19.6 % — ABNORMAL LOW (ref 20.0–50.0)
TRANSFERRIN: 214.6 mg/dL (ref 212.0–360.0)

## 2014-08-14 LAB — HEMOGLOBIN A1C: Hgb A1c MFr Bld: 3.9 % — ABNORMAL LOW (ref 4.6–6.5)

## 2014-08-14 LAB — FERRITIN: Ferritin: 48.6 ng/mL (ref 10.0–291.0)

## 2014-08-14 NOTE — Progress Notes (Signed)
Pre visit review using our clinic review tool, if applicable. No additional management support is needed unless otherwise documented below in the visit note.  Chief Complaint  Patient presents with  . Annual Exam    Mom has declined a pap smear    HPI: Patient.  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  Taking metformin 750 without sx. Remote hx of ocps when developing Yaz monm no ntinterested in her being put on ocps . mpom had wieght gain with sprionolactione Periods about every other month or 2  No longer in school didn't get into [employer] program  Volunteering arc and other activities trying.   Health Maintenance  Topic Date Due  . Pap Smear  02/06/2009  . Influenza Vaccine  05/26/2015  . Tetanus/tdap  05/31/2023   Health Maintenance Review LIFESTYLE:  Exercise:   Tobacco/ETS:no Alcohol: per day no Sugar beverages:no Sleep:interupted for since birth restless sleeper  Drug use: no PAP declined by mom low risk    ROS:  GEN/ HEENT: No fever, significant weight changes sweats headaches vision problems hearing changes, CV/ PULM; No chest pain shortness of breath cough, syncope,edema  change in exercise tolerance. GI /GU: No adominal pain, vomiting, change in bowel habits. No blood in the stool. No significant GU symptoms. SKIN/HEME: ,no acute skin rashes suspicious lesions or bleeding still has dark area skin . No lymphadenopathy, nodules, masses.  NEURO/ PSYCH:  No new neurologic signs or seizure No depression anxiety. IMM/ Allergy: No unusual infections.  Allergy .   REST of 12 system review negative except as per HPI   Past Medical History  Diagnosis Date  .  Seizure disorder   . Acquired acanthosis nigricans   . Hyperinsulinism   . 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 Testing for Rett Syndrome normal.  . Hypoglycemia     with seizures that seem to respond to glucose  . Hirsutism     with elevated free testosterone levels  . Acne   . Striae   . Tachycardia   . Heavy periods   . Nosebleed     with abnormal clotting studies  . Amenorrhea     until beginning glucophage  . ANA positive   . 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 Fractured elbow     x 2 with falls    Family History  Problem Relation Age of Onset  . Diabetes Mother   . Hypertension Mother   . Hyperlipidemia      parent grandparent    History   Social History  . Marital Status: Married    Spouse Name: N/A    Number of Children: N/A  . Years of Education: 11th   Social History Main Topics  . Smoking status: Never Smoker   . Smokeless tobacco: Never Used  . Alcohol Use: No  . Drug Use: No  . Sexual Activity: None   Other Topics Concern  . None   Social History Narrative   hh of  3   [employer]  [employer]  gets    Neg tad   6 hours sleep gets speech therapy at school ( no ot pt)   Patient lives at home with her parents.   Caffeine Use: none    Outpatient Encounter Prescriptions as of 08/14/2014  Medication Sig  . metFORMIN (GLUCOPHAGE-XR) 750 MG 24 hr tablet TAKE 1 TABLET BY MOUTH DAILY WITH BREAKFAST    EXAM:  BP 110/70  Temp(Src) 98.5 F (36.9 C) (Oral)  Ht 5\' 5"  (1.651 m)  Wt 144 lb (65.318 kg)  BMI 23.96 kg/m2  Body mass index is 23.96 kg/(m^2).  Physical Exam: Vital signs reviewed ZOX:WRUEGEN:This is a well-developed well-nourished alert cooperative    who appearsr stated age in no acute distress.  HEENT: normocephalic atraumatic , Eyes: PERRL  conjunctiva clear, Nares: paten,t no deformity discharge or tenderness.,  Ears: no deformity EAC's clear TMs with normal landmarks. Mouth: clear OP, no lesions, edema.  Moist mucous membranes. Dentition in adequate repair. NECK: supple without masses, thyromegaly or bruits. CHEST/PULM:  Clear to auscultation and percussion breath sounds equal no wheeze , rales or rhonchi. No chest wall deformities or tenderness. Breast: normal by inspection . No dimpling, discharge, masses, tenderness or discharge . CV: PMI is nondisplaced, S1 S2 no gallops, murmurs, rubs. Peripheral pulses are full without delay.No JVD .  ABDOMEN: Bowel sounds normal nontender  No guard or rebound, no hepato splenomegal no CVA tenderness.  No hernia. Extremtities:  No clubbing cyanosis or edema, no acute joint swelling or redness no focal atrophy hsa scoliosis  NEURO:  Conversation limtied pleasant  Gait abnormal.  Spastic Distal atrophy  Legs hands no clonus or tremor  SKIN: No acute rashes normal turgor, color, no bruising or petechiae. Thinning hair   Midline body hair abd no hirsutism otherwise  PSYCH: pleaseant mom does most talking  good eye contact, no obvious depression anxiety,  LN: no cervical axillary inguinal adenopathy  Lab Results  Component Value Date   WBC 12.2* 08/07/2014   HGB 14.6 08/07/2014   HCT 42.8 08/07/2014   PLT 274.0 08/07/2014   GLUCOSE 77 08/07/2014   CHOL 126 08/07/2014   TRIG 137.0 08/07/2014   HDL 39.30 08/07/2014   LDLDIRECT 66.8 05/30/2013   LDLCALC 59 08/07/2014   ALT 20 08/07/2014   AST 14 08/07/2014   NA 138 08/07/2014   K 4.3 08/07/2014   CL 102 08/07/2014   CREATININE 0.7 08/07/2014   BUN 7 08/07/2014   CO2 30 08/07/2014   TSH 0.64 08/07/2014   HGBA1C 4.1* 05/30/2013   reviewe record for testosterone ASSESSMENT AND PLAN:  Discussed the following assessment and plan:  Visit for preventive health examination  Prediabetes - Plan: Hemoglobin A1c, IBC panel, Ferritin  Iron deficiency - not anemic now can recheck this as per mom request  - Plan:  Hemoglobin A1c, IBC panel, Ferritin  Developmental delay disorder  PCOS (polycystic ovarian syndrome)  Thyroiditis, 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  Will notify you  of labs when available. IF A1C IS elevated next step would be to increase metformin to 500 mg  Twice a day .  return office visit oin 6 months checking tsh free t4 and hga1c  Pre visit or   As needed    Neta MendsWanda K. Panosh M.D.

## 2014-08-14 NOTE — Patient Instructions (Signed)
Continue lifestyle intervention healthy eating and exercise .  Will notify you  of labs when available. IF A1C IS elevated next step would be to increase metformin to 500 mg  Twice a day .   return office visit oin 6 months checking tsh free t4 and hga1c  Pre visit or   As needed

## 2014-09-06 ENCOUNTER — Other Ambulatory Visit: Payer: Self-pay | Admitting: Internal Medicine

## 2014-09-06 NOTE — Telephone Encounter (Signed)
Sent to the pharmacy by e-scribe. 

## 2015-02-05 ENCOUNTER — Other Ambulatory Visit (INDEPENDENT_AMBULATORY_CARE_PROVIDER_SITE_OTHER): Payer: BC Managed Care – PPO

## 2015-02-05 DIAGNOSIS — R7309 Other abnormal glucose: Secondary | ICD-10-CM

## 2015-02-05 DIAGNOSIS — E069 Thyroiditis, unspecified: Secondary | ICD-10-CM

## 2015-02-05 DIAGNOSIS — R7303 Prediabetes: Secondary | ICD-10-CM

## 2015-02-05 LAB — T3, FREE: T3 FREE: 3 pg/mL (ref 2.3–4.2)

## 2015-02-05 LAB — HEMOGLOBIN A1C: Hgb A1c MFr Bld: 3.9 % — ABNORMAL LOW (ref 4.6–6.5)

## 2015-02-05 LAB — TSH: TSH: 2.64 u[IU]/mL (ref 0.35–4.50)

## 2015-02-05 LAB — T4, FREE: FREE T4: 0.87 ng/dL (ref 0.60–1.60)

## 2015-02-12 ENCOUNTER — Ambulatory Visit (INDEPENDENT_AMBULATORY_CARE_PROVIDER_SITE_OTHER): Payer: BC Managed Care – PPO | Admitting: Internal Medicine

## 2015-02-12 ENCOUNTER — Encounter: Payer: Self-pay | Admitting: Internal Medicine

## 2015-02-12 VITALS — BP 100/66 | Temp 98.1°F | Ht 65.0 in | Wt 146.0 lb

## 2015-02-12 DIAGNOSIS — R400 Somnolence: Secondary | ICD-10-CM | POA: Insufficient documentation

## 2015-02-12 DIAGNOSIS — Z79899 Other long term (current) drug therapy: Secondary | ICD-10-CM | POA: Insufficient documentation

## 2015-02-12 DIAGNOSIS — R7303 Prediabetes: Secondary | ICD-10-CM

## 2015-02-12 DIAGNOSIS — G479 Sleep disorder, unspecified: Secondary | ICD-10-CM | POA: Diagnosis not present

## 2015-02-12 DIAGNOSIS — R7309 Other abnormal glucose: Secondary | ICD-10-CM | POA: Diagnosis not present

## 2015-02-12 DIAGNOSIS — E282 Polycystic ovarian syndrome: Secondary | ICD-10-CM

## 2015-02-12 DIAGNOSIS — E161 Other hypoglycemia: Secondary | ICD-10-CM | POA: Insufficient documentation

## 2015-02-12 NOTE — Patient Instructions (Addendum)
Make sure  Snacks and meals are   Not simple sugars that increase insulin and make hungry in 2 hours  Proteins and complex carbohydrates are the best.  Consdier seeing dietician after a  Survey of  Eating  And sleeping information. Plan on getting a fructosamine level in stead of a1c level  For monitoring and see if more accurated.   Plan on  Glucose tolerance testing .  Call [phone] connect to lab for appt.   And a referral to Dr Vickey Hugerohmeier  About the chronic  sleeping problem . ? If any advice dx or help.  ? If use adderall again .  Thyroid is normal .   Preventive  Wellness visit

## 2015-02-12 NOTE — Progress Notes (Signed)
Pre visit review using our clinic review tool, if applicable. No additional management support is needed unless otherwise documented below in the visit note.  Chief Complaint  Patient presents with  . Follow-up    HPI: Elizabeth Cantrell 24 y.o. comes in ffor fu of    Thyroid and   a1c tests Hs mild anemia and iron studies last time . Last ferttin nl and borderline low ibc  a1c runs low usually   No low bg mom says eats every 2-3 hours   For a long time trying to eat correctly  Has been on metformin for a while for PCOS which has helped her body habitus   fbg  In the 80s  When mom checks.  Has had    interrupted night sleep since younger  Falls asleep easily otherwise.  Last year at interview at [employer] fell asleep at the interview. Negative consequences  ROS: See pertinent positives and negatives per HPI. No cough syncope loc  No snoring osa   Past Medical History  Diagnosis Date  . Seizure disorder   . Acquired acanthosis nigricans   . Hyperinsulinism   . 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 Testing for Rett Syndrome normal.  . Hypoglycemia     with seizures that seem to respond to glucose  . Hirsutism     with elevated free testosterone levels  . Acne   . Striae   . Tachycardia   . Heavy periods   . Nosebleed     with abnormal clotting studies  . Amenorrhea     until beginning glucophage  . ANA positive   . 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 Fractured elbow     x 2 with falls    Family History  Problem Relation Age of Onset  . Diabetes Mother   . Hypertension Mother   . Hyperlipidemia      parent grandparent    History   Social History  . Marital Status: Married    Spouse Name: N/A  . Number of Children: N/A  . Years of Education: 11th   Social History Main Topics  . Smoking status: Never Smoker   . Smokeless tobacco: Never Used  . Alcohol Use:  No  . Drug Use: No  . Sexual Activity: Not on file   Other Topics Concern  . 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   Caffeine Use: none    Outpatient Encounter Prescriptions as of 02/12/2015  Medication Sig  . metFORMIN (GLUCOPHAGE-XR) 750 MG 24 hr tablet TAKE 1 TABLET BY MOUTH DAILY WITH BREAKFAST    EXAM:  BP 100/66 mmHg  Temp(Src) 98.1 F (36.7 C) (Oral)  Ht 5\' 5"  (1.651 m)  Wt 146 lb (66.225 kg)  BMI 24.30 kg/m2  Body mass index is 24.3 kg/(m^2).  GENERAL: vitals reviewed and listed above, alert, oriented, appears well hydrated and in no acute distress MS: moves all extremities without noticeable focal  abnormality PSYCH: pleasant and cooperative, no obvious depression or anxiety here with mom  Alert and interactive will  Be a part of the conversation  Lab Results  Component Value Date   WBC 12.2* 08/07/2014   HGB 14.6 08/07/2014   HCT 42.8 08/07/2014   PLT 274.0 08/07/2014   GLUCOSE 77 08/07/2014   CHOL 126 08/07/2014   TRIG 137.0 08/07/2014   HDL 39.30 08/07/2014   LDLDIRECT 66.8 05/30/2013   LDLCALC 59 08/07/2014   ALT 20 08/07/2014   AST 14 08/07/2014   NA 138 08/07/2014   K 4.3 08/07/2014   CL 102 08/07/2014   CREATININE 0.7 08/07/2014   BUN 7 08/07/2014   CO2 30 08/07/2014   TSH 2.64 02/05/2015   HGBA1C 3.9* 02/05/2015   Wt Readings from Last 3 Encounters:  02/12/15 146 lb (66.225 kg)  08/14/14 144 lb (65.318 kg)  12/05/13 157 lb (71.215 kg)   BP Readings from Last 3 Encounters:  02/12/15 100/66  08/14/14 110/70  12/05/13 116/79  reviewed current labs   ASSESSMENT AND PLAN:  Discussed the following assessment and plan:  PCOS (polycystic ovarian syndrome) - Plan: Glucose Tolerance, 3 hrs, Fructosamine  Hyperinsulinemia  Prediabetes - Plan: Glucose Tolerance, 3 hrs,  Fructosamine  Sleepiness - Plan: Glucose Tolerance, 3 hrs, Fructosamine, Ambulatory referral to Neurology  Sleep trouble - Plan: Ambulatory referral to Neurology  Medication management  has always had a s;leepiness problem even when sugars high .   No low bg fasting at times  80s   Mom can check levels when feels tired and  Pre post eating .  Consider dec dose of  the metformin back to 500 she was on many years ago  Plan GTT   For now stay on the metformin dose .consider decreasing  ( dont know what the data was when started)  Shouldn't cause low alone but will follow  Since a1c has always been low  Since i have known her.  There may be a primary sleep issue   Adding the  Drowsiness issues.  Get opinion from dr Dohmeier as a neuro sleep specialist  .  dont think that hyperglycemia is causing the sx  Check gtt.  -Patient advised to return or notify health care team  if symptoms worsen ,persist or new concerns arise.  Patient Instructions  Make sure  Snacks and meals are   Not simple sugars that increase insulin and make hungry in 2 hours  Proteins and complex carbohydrates are the best.  Consdier seeing dietician after a  Survey of  Eating  And sleeping information. Plan on getting a fructosamine level in stead of a1c level  For monitoring and see if more accurated.   Plan on  Glucose tolerance testing .  Call [phone] connect to lab for appt.   And a referral to Dr Vickey Huger  About the chronic  sleeping problem . ? If any advice dx or help.  ? If use adderall again .  Thyroid is normal .   Preventive  Wellness visit     Neta Mends. Araseli Sherry M.D. Review of [facility] note 2012 a1c was 4.6 ; metformin increased   500 - 750 at that time  NGSP list of interfering substances

## 2015-08-12 ENCOUNTER — Encounter: Payer: Self-pay | Admitting: Internal Medicine

## 2015-08-12 ENCOUNTER — Other Ambulatory Visit: Payer: BC Managed Care – PPO

## 2015-08-12 ENCOUNTER — Ambulatory Visit (INDEPENDENT_AMBULATORY_CARE_PROVIDER_SITE_OTHER)
Admission: RE | Admit: 2015-08-12 | Discharge: 2015-08-12 | Disposition: A | Discharge status: Home / Self Care | Payer: BC Managed Care – PPO | Source: Ambulatory Visit | Attending: Internal Medicine | Admitting: Internal Medicine

## 2015-08-12 ENCOUNTER — Ambulatory Visit (INDEPENDENT_AMBULATORY_CARE_PROVIDER_SITE_OTHER): Payer: BC Managed Care – PPO | Admitting: Internal Medicine

## 2015-08-12 VITALS — BP 106/78 | HR 90 | Temp 98.9°F | Wt 140.7 lb

## 2015-08-12 DIAGNOSIS — R6889 Other general symptoms and signs: Secondary | ICD-10-CM | POA: Diagnosis not present

## 2015-08-12 DIAGNOSIS — J989 Respiratory disorder, unspecified: Secondary | ICD-10-CM | POA: Diagnosis not present

## 2015-08-12 DIAGNOSIS — J189 Pneumonia, unspecified organism: Secondary | ICD-10-CM

## 2015-08-12 DIAGNOSIS — R509 Fever, unspecified: Principal | ICD-10-CM

## 2015-08-12 MED ORDER — AZITHROMYCIN 250 MG PO TABS: 250.0000 mg | ORAL_TABLET | ORAL | Status: DC

## 2015-08-12 MED ORDER — BENZONATATE 100 MG PO CAPS: 100.0000 mg | ORAL_CAPSULE | Freq: Three times a day (TID) | ORAL | Status: DC | PRN

## 2015-08-12 NOTE — Patient Instructions (Signed)
Please get chest x ray done today to make sure not pneumonia is present  . Cannot tell from  Chest exam today. Will be contacted about chest x ray results   This could be flu like illness .  If normal x ray then plan rst fluids  Cough med if wish for night tylenol or advil for fever  Body aches .  Fu if  Fever persisting  past another 2 days or worsening illness  Depending on x ray results

## 2015-08-12 NOTE — Progress Notes (Signed)
Pre visit review using our clinic review tool, if applicable. No additional management support is needed unless otherwise documented below in the visit note.  Chief Complaint  Patient presents with  . Fever    Started last week.  . Cough  . Headache  . Generalized Body Aches    HPI: Patient Elizabeth Cantrell  comes in today for SDA for  new problem evaluation. Here with father  And note from mom. Onset 5 days w ago [pm with cough then low grade fever for 2 days last 2 days high fever 102 range  Malaise ha without vomiting / of neck stiffness . Cough  And not sleeping  No vomiting  She says sov but no wheezing.  No new rash   'father had  Cough illness weeks ago but no sig fever and now better   No flu vaccine this season.  ROS: See pertinent positives and negatives per HPI. ? Some sob  Drinking fluids well  Tylenol and took left over amoxicillin from  Dental procedure  Yesterday 2 doses   Past Medical History  Diagnosis Date  . Seizure disorder (HCC)   . Acquired acanthosis nigricans   . Hyperinsulinism   . 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 Testing for Rett Syndrome normal.  . Hypoglycemia     with seizures that seem to respond to glucose  . Hirsutism     with elevated free testosterone levels  . Acne   . Striae   . Tachycardia   . Heavy periods   . Nosebleed     with abnormal clotting studies  . Amenorrhea     until beginning glucophage  . ANA positive   . 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 Fractured elbow     x 2 with falls    Family History  Problem Relation Age of Onset  . Diabetes Mother   . Hypertension Mother   . Hyperlipidemia      parent grandparent    Social History   Social History  . Marital Status: Married    Spouse Name: N/A  . Number of Children: N/A  . Years of Education: 11th   Social History Main Topics  . Smoking status:  Never Smoker   . Smokeless tobacco: Never Used  . Alcohol Use: No  . Drug Use: No  . Sexual Activity: Not Asked   Other Topics Concern  . None   Social History Narrative   hh of  3   [employer]  [name] gets    Neg tad   6 hours sleep gets speech therapy at school ( no ot pt)   Patient lives at home with her parents.   Caffeine Use: none    Outpatient Prescriptions Prior to Visit  Medication Sig Dispense Refill  . metFORMIN (GLUCOPHAGE-XR) 750 MG 24 hr tablet TAKE 1 TABLET BY MOUTH DAILY WITH BREAKFAST 30 tablet 10   No facility-administered medications prior to visit.     EXAM:  BP 106/78 mmHg  Pulse 90  Temp(Src) 98.9 F (37.2 C) (Oral)  Wt 140 lb 11.2 oz (63.821 kg)  SpO2 96%  LMP 08/07/2015 (Approximate)  Body mass index is 23.41 kg/(m^2).  GENERAL: vitals reviewed and listed above, alert, oriented, appears well hydrated and in no acute distress mod ill non toxic feels hot but neg temp. Quiet respirations  HEENT: atraumatic, conjunctiva  clear, no obvious abnormalities on inspection of external nose and ears tmx clear nares slight congestion  OP : no lesion edema or exudate  NECK: no obvious masses on inspection palpation  Supple no meningismus  LUNGS: clear to auscultation bilaterally, no wheezes, rales or rhonchi, dec bs through out ? From effort > other  Deep cough somewhat loose  CV: HRRR, no clubbing cyanosis or  peripheral edema nl cap refill  Abdomen:  Sof,t normal bowel sounds without hepatosplenomegaly, no guarding rebound or masses no CVA tenderness Nl cap refill  MS: moves all extremities without noticeable focal  abnormality PSYCH: pleasant and cooperative,   ASSESSMENT AND PLAN:  Discussed the following assessment and plan:  Febrile respiratory illness - concern for pna uncertain chest exam dec bs  - Plan: DG Chest 2 View  Pneumonia involving left lung, unspecified part of lung upper lobe - see xray   give azithro fu in 7-10 days  or if fever not responding  Flu-like symptoms  -Patient advised to return or notify health care team  if symptoms worsen ,persist or new concerns arise.  Patient Instructions  Please get chest x ray done today to make sure not pneumonia is present  . Cannot tell from  Chest exam today. Will be contacted about chest x ray results   This could be flu like illness .  If normal x ray then plan rst fluids  Cough med if wish for night tylenol or advil for fever  Body aches .  Fu if  Fever persisting  past another 2 days or worsening illness  Depending on x ray results        Starasia Sinko K. Tej Murdaugh M.D.

## 2015-08-14 ENCOUNTER — Telehealth: Payer: Self-pay | Admitting: Internal Medicine

## 2015-08-14 NOTE — Telephone Encounter (Signed)
Mom call and said her daughter need more medication. She still has a cough,fever  . Transferred Mom to triage

## 2015-08-15 ENCOUNTER — Encounter: Payer: Self-pay | Admitting: Internal Medicine

## 2015-08-15 ENCOUNTER — Ambulatory Visit (INDEPENDENT_AMBULATORY_CARE_PROVIDER_SITE_OTHER): Payer: BC Managed Care – PPO | Admitting: Internal Medicine

## 2015-08-15 VITALS — BP 106/60 | HR 118 | Temp 102.3°F | Wt 140.9 lb

## 2015-08-15 DIAGNOSIS — J181 Lobar pneumonia, unspecified organism: Principal | ICD-10-CM

## 2015-08-15 DIAGNOSIS — J189 Pneumonia, unspecified organism: Secondary | ICD-10-CM

## 2015-08-15 LAB — CBC WITH DIFFERENTIAL/PLATELET
BASOS ABS: 0 10*3/uL (ref 0.0–0.1)
BASOS PCT: 0.1 % (ref 0.0–3.0)
EOS ABS: 0.1 10*3/uL (ref 0.0–0.7)
Eosinophils Relative: 1.1 % (ref 0.0–5.0)
HCT: 35.2 % — ABNORMAL LOW (ref 36.0–46.0)
HEMOGLOBIN: 12 g/dL (ref 12.0–15.0)
Lymphocytes Relative: 12.8 % (ref 12.0–46.0)
Lymphs Abs: 1.4 10*3/uL (ref 0.7–4.0)
MCHC: 34 g/dL (ref 30.0–36.0)
MCV: 87.7 fl (ref 78.0–100.0)
MONO ABS: 0.6 10*3/uL (ref 0.1–1.0)
Monocytes Relative: 5.9 % (ref 3.0–12.0)
NEUTROS ABS: 8.8 10*3/uL — AB (ref 1.4–7.7)
Neutrophils Relative %: 80.1 % — ABNORMAL HIGH (ref 43.0–77.0)
PLATELETS: 257 10*3/uL (ref 150.0–400.0)
RBC: 4.02 Mil/uL (ref 3.87–5.11)
RDW: 13.4 % (ref 11.5–15.5)
WBC: 11 10*3/uL — AB (ref 4.0–10.5)

## 2015-08-15 MED ORDER — LEVOFLOXACIN 750 MG PO TABS: 750.0000 mg | ORAL_TABLET | Freq: Every day | ORAL | Status: DC

## 2015-08-15 MED ORDER — CEFTRIAXONE SODIUM 1 G IJ SOLR
1.0000 g | Freq: Once | INTRAMUSCULAR | Status: AC
  Administered 2015-08-15: 1 g via INTRAMUSCULAR

## 2015-08-15 NOTE — Progress Notes (Signed)
Pre visit review using our clinic review tool, if applicable. No additional management support is needed unless otherwise documented below in the visit note.  Chief Complaint  Patient presents with  . Pneumonia    HPI: Patient Elizabeth Cantrell  comes in today for SDA for  Ongoing problem evaluation. Was seen earlier this week for febril eresp illness and dx with lul pna  .  Begun on z pack and told to fu if fever not going away.   Has had 4 days of azxitho and mom said had some amox left over from dental work ? No better   Cough no lateral cp sob?   Chills and fever and sweats at niht    No vomiting rash pleurisy pain  No travel but father had gone to [place] and  midl illness before hers . ROS: See pertinent positives and negatives per HPI.  Past Medical History  Diagnosis Date  . Seizure disorder (HCC)   . Acquired acanthosis nigricans   . Hyperinsulinism   . 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 Testing for Rett Syndrome normal.  . Hypoglycemia     with seizures that seem to respond to glucose  . Hirsutism     with elevated free testosterone levels  . Acne   . Striae   . Tachycardia   . Heavy periods   . Nosebleed     with abnormal clotting studies  . Amenorrhea     until beginning glucophage  . ANA positive   . 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 Fractured elbow     x 2 with falls    Family History  Problem Relation Age of Onset  . Diabetes Mother   . Hypertension Mother   . Hyperlipidemia      parent grandparent    Social History   Social History  . Marital Status: Married    Spouse Name: N/A  . Number of Children: N/A  . Years of Education: 11th   Social History Main Topics  . Smoking status: Never Smoker   . Smokeless tobacco: Never Used  . Alcohol Use: No  . Drug Use: No  . Sexual Activity: Not Asked   Other Topics Concern  . None    Social History Narrative   hh of  3   [employer]  [name] gets    Neg tad   6 hours sleep gets speech therapy at school ( no ot pt)   Patient lives at home with her parents.   Caffeine Use: none    Outpatient Prescriptions Prior to Visit  Medication Sig Dispense Refill  . benzonatate (TESSALON PERLES) 100 MG capsule Take 1 capsule (100 mg total) by mouth 3 (three) times daily as needed for cough. 24 capsule 0  . metFORMIN (GLUCOPHAGE-XR) 750 MG 24 hr tablet TAKE 1 TABLET BY MOUTH DAILY WITH BREAKFAST 30 tablet 10  . azithromycin (ZITHROMAX Z-PAK) 250 MG tablet Take 1 tablet (250 mg total) by mouth as directed. Take 2 po first day, then 1 po qd 6 tablet 0   No facility-administered medications prior to visit.     EXAM:  BP 106/60 mmHg  Pulse 118  Temp(Src) 102.3 F (39.1 C) (Oral)  Wt 140 lb 14.4 oz (63.912 kg)  SpO2 97%  LMP 08/07/2015 (Approximate)  Body mass index is 23.45 kg/(m^2).  GENERAL:  vitals reviewed and listed above, alert, oriented, appears well hydrated and in no acute distress non toxic but sick quiet respirations HEENT: atraumatic, conjunctiva  clear, no obvious abnormalities on inspection of external nose and ears OP : no lesion edema or exudate  NECK: no obvious masses on inspection  Supple  Neck palpation moitst membranes  LUNGS:  Dec breath sounds l more than right  CV: HRRR, no clubbing cyanosis or  peripheral edema nl cap refill  MS: moves all extremities without noticeable focal  Abnormality  Cap refill 2 seconds  Warm extremities  No rashes  PSYCH: pleasant and cooperative,  Cognition at baseline     ASSESSMENT AND PLAN:  Discussed the following assessment and plan:  Left upper lobe pneumonia - Plan: CBC with Differential/Platelet, Culture, Blood, cefTRIAXone (ROCEPHIN) injection 1 g  CAP (community acquired pneumonia) - Plan: CBC with Differential/Platelet, Culture, Blood, cefTRIAXone (ROCEPHIN) injection 1 g Not responding to   axithro early . puls poss up from fever  oxygenation nl  Disc with mom alarm featuers and expectant   Cbc    blood cx  Rocephin  1 gram  And change to  levaquin   If not a lot better in 24 - 48 hours or if worse seek emergent care    -Patient advised to return or notify health care team  if symptoms worsen ,persist or new concerns arise.  Patient Instructions  Injection of antibiotic today  And   change antibiotic   To broader spectrum antibiotic today.  If fever not subsiding improving nor  getting better in the next 48 hours   Go to ED  Or call on call service about advice .  If improving then ov   Monday or tues next week.       Neta MendsWanda K. Tabias Swayze M.D.

## 2015-08-15 NOTE — Telephone Encounter (Signed)
Pt seen in OV on 08/15/15.  See ov note.

## 2015-08-15 NOTE — Patient Instructions (Addendum)
Injection of antibiotic today  And   change antibiotic   To broader spectrum antibiotic today.  If fever not subsiding improving nor  getting better in the next 48 hours   Go to ED  Or call on call service about advice .  If improving then ov   Monday or tues next week.

## 2015-08-20 ENCOUNTER — Ambulatory Visit: Payer: BC Managed Care – PPO | Admitting: Internal Medicine

## 2015-08-21 LAB — CULTURE, BLOOD (SINGLE): Organism ID, Bacteria: NO GROWTH

## 2015-08-22 ENCOUNTER — Telehealth: Payer: Self-pay | Admitting: Internal Medicine

## 2015-08-22 DIAGNOSIS — R059 Cough, unspecified: Secondary | ICD-10-CM

## 2015-08-22 DIAGNOSIS — R05 Cough: Secondary | ICD-10-CM

## 2015-08-22 NOTE — Telephone Encounter (Signed)
Fu appt  Was canceled .  Make appt for Monday .  cah get chest x ray before visit if not doing well.

## 2015-08-22 NOTE — Telephone Encounter (Signed)
Patient Name: Elizabeth Cantrell DOB: 1991-01-15 Initial Comment Caller states her daughter has pneumonia- She has finished her 7 pills. She is still coughing. Nurse Assessment Guidelines Guideline Title Affirmed Question Affirmed Notes Final Disposition User FINAL ATTEMPT MADE - no message left [place], Charity fundraiserN, Stark BrayLynda

## 2015-08-22 NOTE — Telephone Encounter (Signed)
Left detailed message on machine for patient/mom to call back and schedule an appointment and to go get a chest xray.  Xray order placed.

## 2015-08-25 NOTE — Telephone Encounter (Signed)
Left a message for a return call.

## 2015-08-26 ENCOUNTER — Other Ambulatory Visit: Payer: Self-pay | Admitting: Internal Medicine

## 2015-08-27 ENCOUNTER — Encounter: Payer: Self-pay | Admitting: Internal Medicine

## 2015-08-27 ENCOUNTER — Ambulatory Visit (INDEPENDENT_AMBULATORY_CARE_PROVIDER_SITE_OTHER): Payer: BC Managed Care – PPO | Admitting: Internal Medicine

## 2015-08-27 VITALS — BP 94/60 | HR 80 | Temp 98.1°F | Wt 136.3 lb

## 2015-08-27 DIAGNOSIS — R7303 Prediabetes: Secondary | ICD-10-CM

## 2015-08-27 DIAGNOSIS — E282 Polycystic ovarian syndrome: Secondary | ICD-10-CM

## 2015-08-27 DIAGNOSIS — J189 Pneumonia, unspecified organism: Secondary | ICD-10-CM

## 2015-08-27 DIAGNOSIS — J181 Lobar pneumonia, unspecified organism: Principal | ICD-10-CM

## 2015-08-27 NOTE — Telephone Encounter (Signed)
Pt seen in OV on 08/27/15

## 2015-08-27 NOTE — Patient Instructions (Signed)
May still feel tired with minor cough  After pneumonia  Is treated. f or a few weeks   Contact us if any relapsing symptoms.   refilll stay on the metformin .  Check up wellness visit  And labs to inbcule hg a1c and fructosamine in about 6 months   GET A FLU VACCINE IN THE NEXT MONTH .

## 2015-08-27 NOTE — Telephone Encounter (Signed)
Sent to the pharmacy by e-scribe. 

## 2015-08-27 NOTE — Progress Notes (Signed)
Pre visit review using our clinic review tool, if applicable. No additional management support is needed unless otherwise documented below in the visit note.  Chief Complaint  Patient presents with  . Follow-up    Follow up of PNA and refill of metformin.    HPI: Elizabeth Cantrell 24 y.o. comes in with mopm fu pna and med check  So much better after beginning "new med"  Fever went a way  Cough minor  [place].  Eating well now no new sx .  Called last week to see if should continue  Extend antibiotic but doing well .  Cp went away after 3 days antibiotic   Metformin doing well needs refill was supposed to have cpx this appt but  No new sx  Can reschedule .  hait still thinning as in past  ROS: See pertinent positives and negatives per HPI.no current   Past Medical History  Diagnosis Date  . Seizure disorder (HCC)   . Acquired acanthosis nigricans   . Hyperinsulinism   . 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 Testing for Rett Syndrome normal.  . Hypoglycemia     with seizures that seem to respond to glucose  . Hirsutism     with elevated free testosterone levels  . Acne   . Striae   . Tachycardia   . Heavy periods   . Nosebleed     with abnormal clotting studies  . Amenorrhea     until beginning glucophage  . ANA positive   . 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 Fractured elbow     x 2 with falls    Family History  Problem Relation Age of Onset  . Diabetes Mother   . Hypertension Mother   . Hyperlipidemia      parent grandparent    Social History   Social History  . Marital Status: Married    Spouse Name: N/A  . Number of Children: N/A  . Years of Education: 11th   Social History Main Topics  . Smoking status: Never Smoker   . Smokeless tobacco: Never Used  . Alcohol Use: No  . Drug Use: No  . Sexual Activity: Not Asked    Other Topics Concern  . None   Social History Narrative   hh of  3   [employer]  [name] gets    Neg tad   6 hours sleep gets speech therapy at school ( no ot pt)   Patient lives at home with her parents.   Caffeine Use: none    Outpatient Prescriptions Prior to Visit  Medication Sig Dispense Refill  . benzonatate (TESSALON PERLES) 100 MG capsule Take 1 capsule (100 mg total) by mouth 3 (three) times daily as needed for cough. 24 capsule 0  . levofloxacin (LEVAQUIN) 750 MG tablet Take 1 tablet (750 mg total) by mouth daily. 7 tablet 0  . metFORMIN (GLUCOPHAGE-XR) 750 MG 24 hr tablet TAKE 1 TABLET BY MOUTH DAILY WITH BREAKFAST 30 tablet 10   No facility-administered medications prior to visit.     EXAM:  BP 94/60 mmHg  Pulse 80  Temp(Src) 98.1 F (36.7 C) (Oral)  Wt 136 lb 4.8 oz (61.825 kg)  SpO2 98%  LMP 08/07/2015 (Approximate)  Body mass index is 22.68 kg/(m^2).  GENERAL: vitals reviewed and listed  above, alert, oriented, appears well hydrated and in no acute distress HEENT: atraumatic, conjunctiva  clear, no obvious abnormalities on inspection of external nose and ears  NECK: no obvious masses on inspection palpation  LUNGS: clear to auscultation bilaterally, no wheezes, rales or rhonchi, good air movement CV: HRRR, no clubbing cyanosis or  peripheral edema nl cap refill  MS: moves all extremities without noticeable focal  abnormality PSYCH: pleasant and cooperative Lab Results  Component Value Date   WBC 11.0* 08/15/2015   HGB 12.0 08/15/2015   HCT 35.2* 08/15/2015   PLT 257.0 08/15/2015   GLUCOSE 77 08/07/2014   CHOL 126 08/07/2014   TRIG 137.0 08/07/2014   HDL 39.30 08/07/2014   LDLDIRECT 66.8 05/30/2013   LDLCALC 59 08/07/2014   ALT 20 08/07/2014   AST 14 08/07/2014   NA 138 08/07/2014   K 4.3 08/07/2014   CL 102 08/07/2014   CREATININE 0.7 08/07/2014   BUN 7 08/07/2014   CO2 30 08/07/2014   TSH 2.64 02/05/2015   HGBA1C 3.9*  02/05/2015    ASSESSMENT AND PLAN:  Discussed the following assessment and plan:  Left upper lobe pneumonia - so much better   expectant management check for relapsing sx   Prediabetes  PCOS (polycystic ovarian syndrome) - refill medication  labs in 6 months  wihta 1c and fructosamine  CAP (community acquired pneumonia) Mom wanted to wait on flu vaccine  Cause she was still recovering  Counseled ok to give . -Patient advised to return or notify health care team  if symptoms worsen ,persist or new concerns arise.  Patient Instructions  May still feel tired with minor cough  After pneumonia  Is treated. f or a few weeks   Contact us if any relapsing symptoms.   refilll stay on the metformin .  Check up wellness visit  And labs to inbcule hg a1c and fructosamine in about 6 months   GET A FLU VACCINE IN THE NEXT MONTH .    Neta MendsWanda K. Michele Judy M.D.

## 2015-09-08 ENCOUNTER — Other Ambulatory Visit: Payer: Self-pay | Admitting: Internal Medicine

## 2015-09-10 NOTE — Telephone Encounter (Signed)
Duplicate Request.  Sent in on 08/27/15.

## 2015-09-22 ENCOUNTER — Ambulatory Visit (INDEPENDENT_AMBULATORY_CARE_PROVIDER_SITE_OTHER): Payer: BC Managed Care – PPO | Admitting: Family Medicine

## 2015-09-22 DIAGNOSIS — Z23 Encounter for immunization: Secondary | ICD-10-CM | POA: Diagnosis not present

## 2015-11-14 ENCOUNTER — Other Ambulatory Visit: Payer: Self-pay | Admitting: Internal Medicine

## 2016-02-23 ENCOUNTER — Other Ambulatory Visit: Payer: BC Managed Care – PPO

## 2016-02-29 NOTE — Progress Notes (Signed)
Chief Complaint  Patient presents with  . Annual Exam    HPI: Patient  Elizabeth Cantrell  25 y.o. comes in today for Preventive Health Care visit  Here  with father  Today . No concerns except goes to bed late and gets up late   .    Poss inc hr on the afternoon no sx otherwise     On metformin no sx    Health Maintenance  Topic Date Due  . HIV Screening  02/06/2006  . PAP SMEAR  03/01/2017 (Originally 02/07/2012)  . INFLUENZA VACCINE  05/25/2016  . TETANUS/TDAP  05/31/2023   Health Maintenance Review LIFESTYLE:  Exercise:  Some walking   30 minutes Tobacco/ETS: no Alcohol: no Sugar beverages:smoothies and tea  Sleep: about 8 hours  Drug use: no  Monthly periods pt reports no problem   ROS:  See above  GEN/ HEENT: No fever, significant weight changes sweats headaches vision problems hearing changes, CV/ PULM; No chest pain shortness of breath cough, syncope,edema  change in exercise tolerance. GI /GU: No adominal pain, vomiting, change in bowel habits. No blood in the stool. No significant GU symptoms. SKIN/HEME: ,no acute skin rashes suspicious lesions or bleeding. No lymphadenopathy, nodules, masses.  NEURO/ PSYCH:  No new eurologic signs IMM/ Allergy: No unusual infections.  Allergy .   REST of 12 system review negative except as per HPI   Past Medical History  Diagnosis Date  . Seizure disorder ([place])   . Acquired acanthosis nigricans   . Hyperinsulinism   . 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 Testing for Rett Syndrome normal.  . Hypoglycemia     with seizures that seem to respond to glucose  . Hirsutism     with elevated free testosterone levels  . Acne   . Striae   . Tachycardia   . Heavy periods   . Nosebleed     with abnormal clotting studies  . Amenorrhea     until beginning glucophage  . ANA positive   . 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  Fractured elbow     x 2 with falls    No past surgical history on file.  Family History  Problem Relation Age of Onset  . Diabetes Mother   . Hypertension Mother   . Hyperlipidemia      parent grandparent    Social History   Social History  . Marital Status: Married    Spouse Name: N/A  . Number of Children: N/A  . Years of Education: 11th   Social History Main Topics  . Smoking status: Never Smoker   . Smokeless tobacco: Never Used  . Alcohol Use: No  . Drug Use: No  . Sexual Activity: Not Asked   Other Topics Concern  . None   Social History Narrative   hh of  3   [employer]  [name] gets    Neg tad   6 hours sleep gets speech therapy at school ( no ot pt)   Patient lives at home with her parents.   Caffeine Use: none   No pets     Outpatient Prescriptions Prior to Visit  Medication Sig Dispense Refill  . metFORMIN (GLUCOPHAGE-XR) 750 MG 24 hr tablet TAKE 1 TABLET BY MOUTH DAILY WITH BREAKFAST 30 tablet 5  . benzonatate (TESSALON PERLES) 100 MG  capsule Take 1 capsule (100 mg total) by mouth 3 (three) times daily as needed for cough. 24 capsule 0  . levofloxacin (LEVAQUIN) 750 MG tablet Take 1 tablet (750 mg total) by mouth daily. 7 tablet 0   No facility-administered medications prior to visit.     EXAM:  BP 102/70 mmHg  Pulse 76  Temp(Src) 97.7 F (36.5 C) (Oral)  Ht 5' 4.5" (1.638 m)  Wt 142 lb 1 oz (64.439 kg)  BMI 24.02 kg/m2  SpO2 99%  LMP 11/02/2015 (LMP Unknown)  Body mass index is 24.02 kg/(m^2).  Physical Exam: Vital signs reviewed ALP:FXTK is a  well-nourished alert cooperative    who appearsr stated age in no acute distress.  HEENT:  Microcephalic  atraumatic , Eyes: PERRL EOM's full, conjunctiva clear, Nares: paten,t no deformity discharge or tenderness., Ears: no deformity EAC's clear TMs with normal landmarks. Mouth: clear OP, no lesions, edema.  Moist mucous membranes. Dentition  No acute findings . NECK: supple  without masses, thyromegaly or bruits. CHEST/PULM:  Clear to auscultation and percussion breath sounds equal no wheeze , rales or rhonchi. No chest wall deformities or tenderness.min scoilosis  CV: PMI is nondisplaced, S1 S2 no gallops, murmurs, rubs. Peripheral pulses are full without delay.No JVD .  ABDOMEN: Bowel sounds normal nontender  No guard or rebound, no hepato splenomegal no CVA tenderness.  No hernia. Extremtities:  No clubbing cyanosis or edema, no acute joint swelling or redness no focal atrophy   Breast: normal by inspection . No dimpling, discharge, masses, tenderness or discharge . NEURO:  Oriented x3, cranial nerves 3-12 appear to be intact, no obvious focal weakness,gait within normal limits flat foot abnormal gait SKIN: No acute rashes normal turgor, color, no bruising or petechiae.  Diffuse thin hair on vertex  Of scalp no acne  Or excess heari otherwise  PSYCH:  good eye contact, interview owth father and patient together and separatley   no obvious depression anxiety,  simmple speech  but communicates well and understands  Direction and interactions LN: no cervical axillary inguinal adenopathy    ASSESSMENT AND PLAN:  Discussed the following assessment and plan:  Visit for preventive health examination - Plan: Basic metabolic panel, CBC with Differential/Platelet, Hemoglobin A1c, Hepatic function panel, Lipid panel, TSH, T4, free, Fructosamine, CANCELED: Fructosamine  PCOS (polycystic ovarian syndrome) - Plan: Basic metabolic panel, CBC with Differential/Platelet, Hemoglobin A1c, Hepatic function panel, Lipid panel, TSH, T4, free  Prediabetes - Plan: Hemoglobin A1c, T4, free, Fructosamine, CANCELED: Fructosamine  Medication management - Plan: Basic metabolic panel, CBC with Differential/Platelet, Hemoglobin A1c, Hepatic function panel, Lipid panel, TSH, T4, free Lab ordered to include t4 and fructosamine  Counseled. On sleep phases  Etc  Patient Care Team: Burnis Medin, MD as PCP - General (Internal Medicine) Delrae Rend, MD as Attending Physician (Endocrinology) Patient Instructions  Continue lifestyle intervention healthy eating and exercise . Avoiding sugars and simple  carbs  Will notify you  of labs when available.  Heart sounds fine today . If rapid heart rate allt he time   Or feeling  Weak or hard to exercise then contact us for rehceck.  Stay on the metformin .  Plan fu depending on labs   6- 12 months   Can attend to sleep hygiene and slowly phase in earlier sleep time .Marland Kitchen  Move up sleep time by 30 - 60 minutes every few weeks . Avoid back lighting 2 hours before bed and make sleep area very  dark .  Your brain may be in a different time zone  And treat this like jet lag.    Health Maintenance, Female Adopting a healthy lifestyle and getting preventive care can go a long way to promote health and wellness. Talk with your health care provider about what schedule of regular examinations is right for you. This is a good chance for you to check in with your provider about disease prevention and staying healthy. In between checkups, there are plenty of things you can do on your own. Experts have done a lot of research about which lifestyle changes and preventive measures are most likely to keep you healthy. Ask your health care provider for more information. WEIGHT AND DIET  Eat a healthy diet  Be sure to include plenty of vegetables, fruits, low-fat dairy products, and lean protein.  Do not eat a lot of foods high in solid fats, added sugars, or salt.  Get regular exercise. This is one of the most important things you can do for your health.  Most adults should exercise for at least 150 minutes each week. The exercise should increase your heart rate and make you sweat (moderate-intensity exercise).  Most adults should also do strengthening exercises at least twice a week. This is in addition to the moderate-intensity exercise.  Maintain a  healthy weight  Body mass index (BMI) is a measurement that can be used to identify possible weight problems. It estimates body fat based on height and weight. Your health care provider can help determine your BMI and help you achieve or maintain a healthy weight.  For females 67 years of age and older:   A BMI below 18.5 is considered underweight.  A BMI of 18.5 to 24.9 is normal.  A BMI of 25 to 29.9 is considered overweight.  A BMI of 30 and above is considered obese.  Watch levels of cholesterol and blood lipids  You should start having your blood tested for lipids and cholesterol at 25 years of age, then have this test every 5 years.  You may need to have your cholesterol levels checked more often if:  Your lipid or cholesterol levels are high.  You are older than 25 years of age.  You are at high risk for heart disease.  CANCER SCREENING   Lung Cancer  Lung cancer screening is recommended for adults 38-29 years old who are at high risk for lung cancer because of a history of smoking.  A yearly low-dose CT scan of the lungs is recommended for people who:  Currently smoke.  Have quit within the past 15 years.  Have at least a 30-pack-year history of smoking. A pack year is smoking an average of one pack of cigarettes a day for 1 year.  Yearly screening should continue until it has been 15 years since you quit.  Yearly screening should stop if you develop a health problem that would prevent you from having lung cancer treatment.  Breast Cancer  Practice breast self-awareness. This means understanding how your breasts normally appear and feel.  It also means doing regular breast self-exams. Let your health care provider know about any changes, no matter how small.  If you are in your 20s or 30s, you should have a clinical breast exam (CBE) by a health care provider every 1-3 years as part of a regular health exam.  If you are 71 or older, have a CBE every year.  Also consider having a breast X-ray (mammogram) every  year.  If you have a family history of breast cancer, talk to your health care provider about genetic screening.  If you are at high risk for breast cancer, talk to your health care provider about having an MRI and a mammogram every year.  Breast cancer gene (BRCA) assessment is recommended for women who have family members with BRCA-related cancers. BRCA-related cancers include:  Breast.  Ovarian.  Tubal.  Peritoneal cancers.  Results of the assessment will determine the need for genetic counseling and BRCA1 and BRCA2 testing. Cervical Cancer Your health care provider may recommend that you be screened regularly for cancer of the pelvic organs (ovaries, uterus, and vagina). This screening involves a pelvic examination, including checking for microscopic changes to the surface of your cervix (Pap test). You may be encouraged to have this screening done every 3 years, beginning at age 30.  For women ages 63-65, health care providers may recommend pelvic exams and Pap testing every 3 years, or they may recommend the Pap and pelvic exam, combined with testing for human papilloma virus (HPV), every 5 years. Some types of HPV increase your risk of cervical cancer. Testing for HPV may also be done on women of any age with unclear Pap test results.  Other health care providers may not recommend any screening for nonpregnant women who are considered low risk for pelvic cancer and who do not have symptoms. Ask your health care provider if a screening pelvic exam is right for you.  If you have had past treatment for cervical cancer or a condition that could lead to cancer, you need Pap tests and screening for cancer for at least 20 years after your treatment. If Pap tests have been discontinued, your risk factors (such as having a new sexual partner) need to be reassessed to determine if screening should resume. Some women have medical problems that  increase the chance of getting cervical cancer. In these cases, your health care provider may recommend more frequent screening and Pap tests. Colorectal Cancer  This type of cancer can be detected and often prevented.  Routine colorectal cancer screening usually begins at 25 years of age and continues through 25 years of age.  Your health care provider may recommend screening at an earlier age if you have risk factors for colon cancer.  Your health care provider may also recommend using home test kits to check for hidden blood in the stool.  A small camera at the end of a tube can be used to examine your colon directly (sigmoidoscopy or colonoscopy). This is done to check for the earliest forms of colorectal cancer.  Routine screening usually begins at age 51.  Direct examination of the colon should be repeated every 5-10 years through 25 years of age. However, you may need to be screened more often if early forms of precancerous polyps or small growths are found. Skin Cancer  Check your skin from head to toe regularly.  Tell your health care provider about any new moles or changes in moles, especially if there is a change in a mole's shape or color.  Also tell your health care provider if you have a mole that is larger than the size of a pencil eraser.  Always use sunscreen. Apply sunscreen liberally and repeatedly throughout the day.  Protect yourself by wearing long sleeves, pants, a wide-brimmed hat, and sunglasses whenever you are outside. HEART DISEASE, DIABETES, AND HIGH BLOOD PRESSURE   High blood pressure causes heart disease and increases the  risk of stroke. High blood pressure is more likely to develop in:  People who have blood pressure in the high end of the normal range (130-139/85-89 mm Hg).  People who are overweight or obese.  People who are African American.  If you are 69-44 years of age, have your blood pressure checked every 3-5 years. If you are 29 years of  age or older, have your blood pressure checked every year. You should have your blood pressure measured twice--once when you are at a hospital or clinic, and once when you are not at a hospital or clinic. Record the average of the two measurements. To check your blood pressure when you are not at a hospital or clinic, you can use:  An automated blood pressure machine at a pharmacy.  A home blood pressure monitor.  If you are between 19 years and 51 years old, ask your health care provider if you should take aspirin to prevent strokes.  Have regular diabetes screenings. This involves taking a blood sample to check your fasting blood sugar level.  If you are at a normal weight and have a low risk for diabetes, have this test once every three years after 25 years of age.  If you are overweight and have a high risk for diabetes, consider being tested at a younger age or more often. PREVENTING INFECTION  Hepatitis B  If you have a higher risk for hepatitis B, you should be screened for this virus. You are considered at high risk for hepatitis B if:  You were born in a country where hepatitis B is common. Ask your health care provider which countries are considered high risk.  Your parents were born in a high-risk country, and you have not been immunized against hepatitis B (hepatitis B vaccine).  You have HIV or AIDS.  You use needles to inject street drugs.  You live with someone who has hepatitis B.  You have had sex with someone who has hepatitis B.  You get hemodialysis treatment.  You take certain medicines for conditions, including cancer, organ transplantation, and autoimmune conditions. Hepatitis C  Blood testing is recommended for:  Everyone born from [birth year] through 1965.  Anyone with known risk factors for hepatitis C. Sexually transmitted infections (STIs)  You should be screened for sexually transmitted infections (STIs) including gonorrhea and chlamydia if:  You are  sexually active and are younger than 25 years of age.  You are older than 25 years of age and your health care provider tells you that you are at risk for this type of infection.  Your sexual activity has changed since you were last screened and you are at an increased risk for chlamydia or gonorrhea. Ask your health care provider if you are at risk.  If you do not have HIV, but are at risk, it may be recommended that you take a prescription medicine daily to prevent HIV infection. This is called pre-exposure prophylaxis (PrEP). You are considered at risk if:  You are sexually active and do not regularly use condoms or know the HIV status of your partner(s).  You take drugs by injection.  You are sexually active with a partner who has HIV. Talk with your health care provider about whether you are at high risk of being infected with HIV. If you choose to begin PrEP, you should first be tested for HIV. You should then be tested every 3 months for as long as you are taking PrEP.  PREGNANCY  If you are premenopausal and you may become pregnant, ask your health care provider about preconception counseling.  If you may become pregnant, take 400 to 800 micrograms (mcg) of folic acid every day.  If you want to prevent pregnancy, talk to your health care provider about birth control (contraception). OSTEOPOROSIS AND MENOPAUSE   Osteoporosis is a disease in which the bones lose minerals and strength with aging. This can result in serious bone fractures. Your risk for osteoporosis can be identified using a bone density scan.  If you are 33 years of age or older, or if you are at risk for osteoporosis and fractures, ask your health care provider if you should be screened.  Ask your health care provider whether you should take a calcium or vitamin D supplement to lower your risk for osteoporosis.  Menopause may have certain physical symptoms and risks.  Hormone replacement therapy may reduce some  of these symptoms and risks. Talk to your health care provider about whether hormone replacement therapy is right for you.  HOME CARE INSTRUCTIONS   Schedule regular health, dental, and eye exams.  Stay current with your immunizations.   Do not use any tobacco products including cigarettes, chewing tobacco, or electronic cigarettes.  If you are pregnant, do not drink alcohol.  If you are breastfeeding, limit how much and how often you drink alcohol.  Limit alcohol intake to no more than 1 drink per day for nonpregnant women. One drink equals 12 ounces of beer, 5 ounces of wine, or 1 ounces of hard liquor.  Do not use street drugs.  Do not share needles.  Ask your health care provider for help if you need support or information about quitting drugs.  Tell your health care provider if you often feel depressed.  Tell your health care provider if you have ever been abused or do not feel safe at home.   This information is not intended to replace advice given to you by your health care provider. Make sure you discuss any questions you have with your health care provider.   Document Released: 04/26/2011 Document Revised: 11/01/2014 Document Reviewed: 09/12/2013 Elsevier Interactive Patient Education 2016 [place]. [place]avin Sebree M.D.

## 2016-03-01 ENCOUNTER — Encounter: Payer: Self-pay | Admitting: Internal Medicine

## 2016-03-01 ENCOUNTER — Ambulatory Visit (INDEPENDENT_AMBULATORY_CARE_PROVIDER_SITE_OTHER): Payer: BC Managed Care – PPO | Admitting: Internal Medicine

## 2016-03-01 VITALS — BP 102/70 | HR 76 | Temp 97.7°F | Ht 64.5 in | Wt 142.1 lb

## 2016-03-01 DIAGNOSIS — Z79899 Other long term (current) drug therapy: Secondary | ICD-10-CM | POA: Diagnosis not present

## 2016-03-01 DIAGNOSIS — R7303 Prediabetes: Secondary | ICD-10-CM | POA: Diagnosis not present

## 2016-03-01 DIAGNOSIS — Z0000 Encounter for general adult medical examination without abnormal findings: Secondary | ICD-10-CM

## 2016-03-01 DIAGNOSIS — E282 Polycystic ovarian syndrome: Secondary | ICD-10-CM | POA: Diagnosis not present

## 2016-03-01 LAB — CBC WITH DIFFERENTIAL/PLATELET
BASOS ABS: 0 10*3/uL (ref 0.0–0.1)
Basophils Relative: 0.3 % (ref 0.0–3.0)
Eosinophils Absolute: 0.1 10*3/uL (ref 0.0–0.7)
Eosinophils Relative: 0.8 % (ref 0.0–5.0)
HCT: 40.4 % (ref 36.0–46.0)
HEMOGLOBIN: 14.3 g/dL (ref 12.0–15.0)
LYMPHS ABS: 2.4 10*3/uL (ref 0.7–4.0)
Lymphocytes Relative: 27.7 % (ref 12.0–46.0)
MCHC: 35.3 g/dL (ref 30.0–36.0)
MCV: 87.2 fl (ref 78.0–100.0)
MONO ABS: 0.3 10*3/uL (ref 0.1–1.0)
MONOS PCT: 3.8 % (ref 3.0–12.0)
NEUTROS PCT: 67.4 % (ref 43.0–77.0)
Neutro Abs: 5.9 10*3/uL (ref 1.4–7.7)
Platelets: 241 10*3/uL (ref 150.0–400.0)
RBC: 4.64 Mil/uL (ref 3.87–5.11)
RDW: 14.2 % (ref 11.5–15.5)
WBC: 8.7 10*3/uL (ref 4.0–10.5)

## 2016-03-01 LAB — BASIC METABOLIC PANEL
BUN: 9 mg/dL (ref 6–23)
CALCIUM: 9.4 mg/dL (ref 8.4–10.5)
CO2: 28 mEq/L (ref 19–32)
Chloride: 102 mEq/L (ref 96–112)
Creatinine, Ser: 0.54 mg/dL (ref 0.40–1.20)
GFR: 146.13 mL/min (ref >60.00)
GLUCOSE: 92 mg/dL (ref 70–99)
POTASSIUM: 4 meq/L (ref 3.5–5.1)
SODIUM: 137 meq/L (ref 135–145)

## 2016-03-01 LAB — HEPATIC FUNCTION PANEL
ALK PHOS: 68 U/L (ref 39–117)
ALT: 14 U/L (ref 0–35)
AST: 12 U/L (ref 0–37)
Albumin: 4.2 g/dL (ref 3.5–5.2)
BILIRUBIN DIRECT: 0.1 mg/dL (ref 0.0–0.3)
Total Bilirubin: 0.7 mg/dL (ref 0.2–1.2)
Total Protein: 7.8 g/dL (ref 6.0–8.3)

## 2016-03-01 LAB — HEMOGLOBIN A1C: HEMOGLOBIN A1C: 4.1 % — AB (ref 4.6–6.5)

## 2016-03-01 LAB — LIPID PANEL
Cholesterol: 126 mg/dL (ref 0–200)
HDL: 48.3 mg/dL (ref >39.00)
LDL CALC: 58 mg/dL (ref 0–99)
NONHDL: 77.72
Total CHOL/HDL Ratio: 3
Triglycerides: 98 mg/dL (ref 0.0–149.0)
VLDL: 19.6 mg/dL (ref 0.0–40.0)

## 2016-03-01 LAB — T4, FREE: FREE T4: 0.95 ng/dL (ref 0.60–1.60)

## 2016-03-01 LAB — TSH: TSH: 1.39 u[IU]/mL (ref 0.35–4.50)

## 2016-03-01 NOTE — Patient Instructions (Addendum)
Continue lifestyle intervention healthy eating and exercise . Avoiding sugars and simple  carbs  Will notify you  of labs when available.  Heart sounds fine today . If rapid heart rate allt he time   Or feeling  Weak or hard to exercise then contact us for rehceck.  Stay on the metformin .  Plan fu depending on labs   6- 12 months   Can attend to sleep hygiene and slowly phase in earlier sleep time .Marland Kitchen  Move up sleep time by 30 - 60 minutes every few weeks . Avoid back lighting 2 hours before bed and make sleep area very dark .  Your brain may be in a different time zone  And treat this like jet lag.    Health Maintenance, Female Adopting a healthy lifestyle and getting preventive care can go a long way to promote health and wellness. Talk with your health care provider about what schedule of regular examinations is right for you. This is a good chance for you to check in with your provider about disease prevention and staying healthy. In between checkups, there are plenty of things you can do on your own. Experts have done a lot of research about which lifestyle changes and preventive measures are most likely to keep you healthy. Ask your health care provider for more information. WEIGHT AND DIET  Eat a healthy diet  Be sure to include plenty of vegetables, fruits, low-fat dairy products, and lean protein.  Do not eat a lot of foods high in solid fats, added sugars, or salt.  Get regular exercise. This is one of the most important things you can do for your health.  Most adults should exercise for at least 150 minutes each week. The exercise should increase your heart rate and make you sweat (moderate-intensity exercise).  Most adults should also do strengthening exercises at least twice a week. This is in addition to the moderate-intensity exercise.  Maintain a healthy weight  Body mass index (BMI) is a measurement that can be used to identify possible weight problems. It estimates  body fat based on height and weight. Your health care provider can help determine your BMI and help you achieve or maintain a healthy weight.  For females 30 years of age and older:   A BMI below 18.5 is considered underweight.  A BMI of 18.5 to 24.9 is normal.  A BMI of 25 to 29.9 is considered overweight.  A BMI of 30 and above is considered obese.  Watch levels of cholesterol and blood lipids  You should start having your blood tested for lipids and cholesterol at 25 years of age, then have this test every 5 years.  You may need to have your cholesterol levels checked more often if:  Your lipid or cholesterol levels are high.  You are older than 25 years of age.  You are at high risk for heart disease.  CANCER SCREENING   Lung Cancer  Lung cancer screening is recommended for adults 22-73 years old who are at high risk for lung cancer because of a history of smoking.  A yearly low-dose CT scan of the lungs is recommended for people who:  Currently smoke.  Have quit within the past 15 years.  Have at least a 30-pack-year history of smoking. A pack year is smoking an average of one pack of cigarettes a day for 1 year.  Yearly screening should continue until it has been 15 years since you quit.  Yearly  screening should stop if you develop a health problem that would prevent you from having lung cancer treatment.  Breast Cancer  Practice breast self-awareness. This means understanding how your breasts normally appear and feel.  It also means doing regular breast self-exams. Let your health care provider know about any changes, no matter how small.  If you are in your 20s or 30s, you should have a clinical breast exam (CBE) by a health care provider every 1-3 years as part of a regular health exam.  If you are 14 or older, have a CBE every year. Also consider having a breast X-ray (mammogram) every year.  If you have a family history of breast cancer, talk to your  health care provider about genetic screening.  If you are at high risk for breast cancer, talk to your health care provider about having an MRI and a mammogram every year.  Breast cancer gene (BRCA) assessment is recommended for women who have family members with BRCA-related cancers. BRCA-related cancers include:  Breast.  Ovarian.  Tubal.  Peritoneal cancers.  Results of the assessment will determine the need for genetic counseling and BRCA1 and BRCA2 testing. Cervical Cancer Your health care provider may recommend that you be screened regularly for cancer of the pelvic organs (ovaries, uterus, and vagina). This screening involves a pelvic examination, including checking for microscopic changes to the surface of your cervix (Pap test). You may be encouraged to have this screening done every 3 years, beginning at age 2.  For women ages 69-65, health care providers may recommend pelvic exams and Pap testing every 3 years, or they may recommend the Pap and pelvic exam, combined with testing for human papilloma virus (HPV), every 5 years. Some types of HPV increase your risk of cervical cancer. Testing for HPV may also be done on women of any age with unclear Pap test results.  Other health care providers may not recommend any screening for nonpregnant women who are considered low risk for pelvic cancer and who do not have symptoms. Ask your health care provider if a screening pelvic exam is right for you.  If you have had past treatment for cervical cancer or a condition that could lead to cancer, you need Pap tests and screening for cancer for at least 20 years after your treatment. If Pap tests have been discontinued, your risk factors (such as having a new sexual partner) need to be reassessed to determine if screening should resume. Some women have medical problems that increase the chance of getting cervical cancer. In these cases, your health care provider may recommend more frequent  screening and Pap tests. Colorectal Cancer  This type of cancer can be detected and often prevented.  Routine colorectal cancer screening usually begins at 25 years of age and continues through 25 years of age.  Your health care provider may recommend screening at an earlier age if you have risk factors for colon cancer.  Your health care provider may also recommend using home test kits to check for hidden blood in the stool.  A small camera at the end of a tube can be used to examine your colon directly (sigmoidoscopy or colonoscopy). This is done to check for the earliest forms of colorectal cancer.  Routine screening usually begins at age 38.  Direct examination of the colon should be repeated every 5-10 years through 25 years of age. However, you may need to be screened more often if early forms of precancerous polyps or small  growths are found. Skin Cancer  Check your skin from head to toe regularly.  Tell your health care provider about any new moles or changes in moles, especially if there is a change in a mole's shape or color.  Also tell your health care provider if you have a mole that is larger than the size of a pencil eraser.  Always use sunscreen. Apply sunscreen liberally and repeatedly throughout the day.  Protect yourself by wearing long sleeves, pants, a wide-brimmed hat, and sunglasses whenever you are outside. HEART DISEASE, DIABETES, AND HIGH BLOOD PRESSURE   High blood pressure causes heart disease and increases the risk of stroke. High blood pressure is more likely to develop in:  People who have blood pressure in the high end of the normal range (130-139/85-89 mm Hg).  People who are overweight or obese.  People who are African American.  If you are 50-38 years of age, have your blood pressure checked every 3-5 years. If you are 51 years of age or older, have your blood pressure checked every year. You should have your blood pressure measured twice--once  when you are at a hospital or clinic, and once when you are not at a hospital or clinic. Record the average of the two measurements. To check your blood pressure when you are not at a hospital or clinic, you can use:  An automated blood pressure machine at a pharmacy.  A home blood pressure monitor.  If you are between 64 years and 80 years old, ask your health care provider if you should take aspirin to prevent strokes.  Have regular diabetes screenings. This involves taking a blood sample to check your fasting blood sugar level.  If you are at a normal weight and have a low risk for diabetes, have this test once every three years after 25 years of age.  If you are overweight and have a high risk for diabetes, consider being tested at a younger age or more often. PREVENTING INFECTION  Hepatitis B  If you have a higher risk for hepatitis B, you should be screened for this virus. You are considered at high risk for hepatitis B if:  You were born in a country where hepatitis B is common. Ask your health care provider which countries are considered high risk.  Your parents were born in a high-risk country, and you have not been immunized against hepatitis B (hepatitis B vaccine).  You have HIV or AIDS.  You use needles to inject street drugs.  You live with someone who has hepatitis B.  You have had sex with someone who has hepatitis B.  You get hemodialysis treatment.  You take certain medicines for conditions, including cancer, organ transplantation, and autoimmune conditions. Hepatitis C  Blood testing is recommended for:  Everyone born from [birth year] through 1965.  Anyone with known risk factors for hepatitis C. Sexually transmitted infections (STIs)  You should be screened for sexually transmitted infections (STIs) including gonorrhea and chlamydia if:  You are sexually active and are younger than 25 years of age.  You are older than 25 years of age and your health care  provider tells you that you are at risk for this type of infection.  Your sexual activity has changed since you were last screened and you are at an increased risk for chlamydia or gonorrhea. Ask your health care provider if you are at risk.  If you do not have HIV, but are at risk, it may be recommended  that you take a prescription medicine daily to prevent HIV infection. This is called pre-exposure prophylaxis (PrEP). You are considered at risk if:  You are sexually active and do not regularly use condoms or know the HIV status of your partner(s).  You take drugs by injection.  You are sexually active with a partner who has HIV. Talk with your health care provider about whether you are at high risk of being infected with HIV. If you choose to begin PrEP, you should first be tested for HIV. You should then be tested every 3 months for as long as you are taking PrEP.  PREGNANCY   If you are premenopausal and you may become pregnant, ask your health care provider about preconception counseling.  If you may become pregnant, take 400 to 800 micrograms (mcg) of folic acid every day.  If you want to prevent pregnancy, talk to your health care provider about birth control (contraception). OSTEOPOROSIS AND MENOPAUSE   Osteoporosis is a disease in which the bones lose minerals and strength with aging. This can result in serious bone fractures. Your risk for osteoporosis can be identified using a bone density scan.  If you are 88 years of age or older, or if you are at risk for osteoporosis and fractures, ask your health care provider if you should be screened.  Ask your health care provider whether you should take a calcium or vitamin D supplement to lower your risk for osteoporosis.  Menopause may have certain physical symptoms and risks.  Hormone replacement therapy may reduce some of these symptoms and risks. Talk to your health care provider about whether hormone replacement therapy is  right for you.  HOME CARE INSTRUCTIONS   Schedule regular health, dental, and eye exams.  Stay current with your immunizations.   Do not use any tobacco products including cigarettes, chewing tobacco, or electronic cigarettes.  If you are pregnant, do not drink alcohol.  If you are breastfeeding, limit how much and how often you drink alcohol.  Limit alcohol intake to no more than 1 drink per day for nonpregnant women. One drink equals 12 ounces of beer, 5 ounces of wine, or 1 ounces of hard liquor.  Do not use street drugs.  Do not share needles.  Ask your health care provider for help if you need support or information about quitting drugs.  Tell your health care provider if you often feel depressed.  Tell your health care provider if you have ever been abused or do not feel safe at home.   This information is not intended to replace advice given to you by your health care provider. Make sure you discuss any questions you have with your health care provider.   Document Released: 04/26/2011 Document Revised: 11/01/2014 Document Reviewed: 09/12/2013 Elsevier Interactive Patient Education [employer].

## 2016-03-01 NOTE — Assessment & Plan Note (Signed)
Stay on metformin check a1c and fructosamine today

## 2016-03-01 NOTE — Progress Notes (Signed)
Pre visit review using our clinic review tool, if applicable. No additional management support is needed unless otherwise documented below in the visit note. 

## 2016-03-05 ENCOUNTER — Encounter: Payer: Self-pay | Admitting: Family Medicine

## 2016-03-09 ENCOUNTER — Telehealth: Payer: Self-pay | Admitting: Family Medicine

## 2016-03-09 NOTE — Telephone Encounter (Signed)
Patient's mom stopped by the office today to get lab results from [facility].  Informed her that they were normal and we ar still waiting on 1 test to come back.  Will call her with that result when available.

## 2016-03-23 ENCOUNTER — Other Ambulatory Visit: Payer: Self-pay | Admitting: Internal Medicine

## 2016-06-15 ENCOUNTER — Encounter: Payer: Self-pay | Admitting: Diagnostic Neuroimaging

## 2016-06-15 ENCOUNTER — Ambulatory Visit (INDEPENDENT_AMBULATORY_CARE_PROVIDER_SITE_OTHER): Payer: BC Managed Care – PPO | Admitting: Diagnostic Neuroimaging

## 2016-06-15 VITALS — BP 118/71 | HR 80 | Ht 62.0 in | Wt 142.4 lb

## 2016-06-15 DIAGNOSIS — E282 Polycystic ovarian syndrome: Secondary | ICD-10-CM

## 2016-06-15 DIAGNOSIS — R6250 Unspecified lack of expected normal physiological development in childhood: Secondary | ICD-10-CM

## 2016-06-15 DIAGNOSIS — G4721 Circadian rhythm sleep disorder, delayed sleep phase type: Secondary | ICD-10-CM | POA: Diagnosis not present

## 2016-06-15 DIAGNOSIS — R261 Paralytic gait: Secondary | ICD-10-CM

## 2016-06-15 DIAGNOSIS — IMO0002 Reserved for concepts with insufficient information to code with codable children: Secondary | ICD-10-CM

## 2016-06-15 NOTE — Patient Instructions (Signed)

## 2016-06-15 NOTE — Progress Notes (Signed)
[facility]  PATIENT: Elizabeth Cantrell DOB: [birth year]  REFERRING CLINICIAN:  HISTORY FROM: patient and father  REASON FOR VISIT: follow up   HISTORICAL  CHIEF COMPLAINT:  Chief Complaint  Patient presents with  . Seizures    rm 7, last seen 11/2013, father- Pablo LawrenceJia Rueger, "note from mother- not sleeping well, mult falls"  . Follow-up    last seen 11/2013    HISTORY OF PRESENT ILLNESS:   UPDATE 06/15/16: Patient returns to day with father. Father and mother note worsening sleep problems (diff falling and staying asleep). This is [facility] in description [facility], but family feel like problem is worse. Balance attacks continue. Stamina is reduced. No convulsive seizures.   UPDATE 12/05/13: Since last visit, continues to have "minor seizures" consisting of drop attacks with postictal confusion. She has 4-5 days per year and takes 24 hours to fully recover. He seizures seem to occur when patient is traveling or sleep deprived. No generalized convulsive seizures. Patient also having difficulty with sleep, difficulty falling and staying asleep. She typically wakes up intermittently throughout the night, finally waking up around 9 in the morning. She also takes naps in the daytime because of excessive sleepiness. No snoring or apnea.  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 No other complications. Patient was born by spontaneous vaginal delivery with no complications. The first year of her life her omental milestones were delayed. Patient denies bowel to 306 months old. She did not sit up until 25-year-old. She never  rolled over a one to crawl. She started to walk around 25 years old. She has difficulty climbing stairs. She has significant speech delay did not speak at all to age 25 months old. At age 89 or so she started speaking in sentences. She had impaired social interaction, poor eye contact and motor skills throughout her early childhood.  Brandi months old she was noted to have roving eye movements. Around 369 months old she had generalized convulsive seizures. She is placed on Depakote initially, then switched to Tegretol after [place] in [place]. Patient continues to have seizures until age 25 years old. Her grand mal convulsive seizures then subsided and patient was taken off of Tegretol.  Since that time she's had a different type of episode consisting of sudden onset imbalance, falling down to either the right or left side. This is followed by either right or left sided paralysis. Reports of confusion, staring have been described at patient's mother and bystanders. Having 1-3 episodes per year. [facility] is the possibility that these episodes were not definitely seizures. She's not been restarted on anticonvulsant therapy.   REVIEW OF SYSTEMS: Full 14 system review of systems performed and notable only for fatigue anemia is still vision loss of vision shortness of breath palpitations him it because it is joint pain aching muscles back pain seizures difficulty speaking weakness tremor passing out memory loss dizziness headache easy bleeding.  ALLERGIES: No Known Allergies  HOME MEDICATIONS: Outpatient Medications Prior to Visit  Medication Sig Dispense Refill  . metFORMIN (GLUCOPHAGE-XR) 750 MG 24 hr tablet TAKE 1 TABLET BY MOUTH DAILY WITH BREAKFAST 30 tablet 5   No  facility-administered medications prior to visit.     PAST MEDICAL HISTORY: Past Medical History:  Diagnosis Date  . Acne   . Acquired acanthosis nigricans   . Amenorrhea     until beginning glucophage  . ANA positive   . 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 Testing for Rett Syndrome normal.  . Fractured elbow    x 2 with falls  . Heavy periods   . Hirsutism    with elevated free testosterone levels  . Hyperinsulinism   . Hypoglycemia    with seizures that seem to respond to glucose  . Nosebleed    with abnormal clotting studies  . Seizure disorder (HCC)   . Seizures (HCC)    hx of  . Striae   . Tachycardia   . 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  FAMILY HISTORY: Family History  Problem Relation Age of Onset  . Diabetes Mother   . Hypertension Mother   . Hyperlipidemia      parent grandparent    SOCIAL HISTORY:  Social History   Social History  . Marital status: Married    Spouse name: N/A  . Number of children: N/A  . Years of education: 9   Occupational History  . Not on file.   Social History Main Topics  . Smoking status: Never Smoker  . Smokeless tobacco: Never Used  . Alcohol use No  . Drug use: No  . Sexual activity: Not on file   Other Topics Concern  . Not on file   Social History Narrative   hh of  3   [employer]  [name] gets    Neg tad   6 hours sleep gets speech therapy at school ( no ot pt)   Patient lives at home with her parents.   Caffeine Use: none   No pets      PHYSICAL EXAM  Vitals:   06/15/16 1503  BP: 118/71  Pulse: 80  Weight: 142 lb 6.4 oz (64.6 kg)  Height: 5\' 2"  (1.575 m)    Not recorded      Body mass index is 26.05 kg/m.  GENERAL EXAM: Patient is in no distress; well developed, nourished and groomed; neck is supple  CARDIOVASCULAR: Regular rate and rhythm, no murmurs, no carotid bruits  NEUROLOGIC: MENTAL STATUS: awake, alert; DECR FLUENCY; COMPREHENSION INTACT. SHORT  SENTENCES. CRANIAL NERVE: no papilledema on fundoscopic exam, pupils equal and reactive to light, visual fields full to confrontation, extraocular muscles intact, no nystagmus, facial sensation and strength symmetric, hearing intact, palate elevates symmetrically, uvula midline, shoulder shrug symmetric, tongue midline. MOTOR: INCREASED TONE IN BUE AND BLE. SYMM 4-5 STRENGTH IN BUE AND BLE.  SENSORY: normal and symmetric to light touch COORDINATION: finger-nose-finger, fine finger movements SLOW REFLEXES: deep tendon reflexes present and symmetric GAIT/STATION: narrow based gait; SPASTIC GAIT.    DIAGNOSTIC DATA (LABS, IMAGING, TESTING) - I reviewed patient records, labs, notes, testing and imaging myself where available.  Lab Results  Component Value Date   WBC 8.7 03/01/2016   HGB 14.3 03/01/2016   HCT 40.4 03/01/2016   MCV 87.2 03/01/2016   PLT 241.0 03/01/2016      Component Value Date/Time   NA 137 03/01/2016 1112   K 4.0 03/01/2016 1112   CL 102 03/01/2016 1112  CO2 28 03/01/2016 1112   GLUCOSE 92 03/01/2016 1112   BUN 9 03/01/2016 1112   CREATININE 0.54 03/01/2016 1112   CALCIUM 9.4 03/01/2016 1112   PROT 7.8 03/01/2016 1112   ALBUMIN 4.2 03/01/2016 1112   AST 12 03/01/2016 1112   ALT 14 03/01/2016 1112   ALKPHOS 68 03/01/2016 1112   BILITOT 0.7 03/01/2016 1112   Lab Results  Component Value Date   CHOL 126 03/01/2016   HDL 48.30 03/01/2016   LDLCALC 58 03/01/2016   LDLDIRECT 66.8 05/30/2013   TRIG 98.0 03/01/2016   CHOLHDL 3 03/01/2016   Lab Results  Component Value Date   HGBA1C 4.1 (L) 03/01/2016   Lab Results  Component Value Date   VITAMINB12 452 06/17/2011   Lab Results  Component Value Date   TSH 1.39 03/01/2016    03/22/05 MRI brain - No MR evidence of seizure focus.  04/15/05 MRI cervical/thoracic spine 1. Mild thoracic dextroscoliosis. 2. Tiny focus of increased T2 signal within the cervical spinal series 10, images 12-13) which could  represent a tiny syrinx or a normal anatomic variant. This is of questionable clinical significance.  12/28/13 EEG - normal     ASSESSMENT AND PLAN  25 y.o. female with history of developmental delay, seizures, spastic gait. She's [facility]. Continues episodes of falling down, confusion may represent ongoing partial seizures, but is not quite clear. She is having 4-5 episodes per year. No convulsive seizures. Also with poor sleep quality (staying awake late, falling asleep during day time).    Dx:  Developmental delay disorder  PCOS (polycystic ovarian syndrome)  Radiation exposure  Spastic gait  Delayed sleep phase syndrome     PLAN: - sleep hygiene strategies reviewed - gradually increase activity as tolerated  - trial of PT for gait and balance training and deconditioning  Orders Placed This Encounter  Procedures  . Ambulatory referral to Physical Therapy   Return if symptoms worsen or fail to improve, for return to PCP.    Suanne MarkerVIKRAM R. Aul Mangieri, MD 06/15/2016, 3:41 PM Certified in Neurology, Neurophysiology and Neuroimaging  [facility] [address], [address] [place], [place] [address] [phone]

## 2016-07-26 ENCOUNTER — Ambulatory Visit: Payer: BC Managed Care – PPO | Attending: Diagnostic Neuroimaging | Admitting: Rehabilitation

## 2016-07-26 ENCOUNTER — Encounter: Payer: Self-pay | Admitting: Rehabilitation

## 2016-07-26 DIAGNOSIS — R2681 Unsteadiness on feet: Secondary | ICD-10-CM | POA: Diagnosis present

## 2016-07-26 DIAGNOSIS — R2689 Other abnormalities of gait and mobility: Secondary | ICD-10-CM | POA: Diagnosis present

## 2016-07-26 DIAGNOSIS — M6281 Muscle weakness (generalized): Secondary | ICD-10-CM

## 2016-07-26 NOTE — Patient Instructions (Signed)
Hip Flexion / Knee Extension: Straight-Leg Raise (Eccentric)    Lie on back. Lift leg with knee straight. Slowly lower leg for 3-5 seconds. _10__ reps per set (on each side) , _2__ sets per day, __5-7_ days per week. Lower like elevator, stopping at each floor.  Copyright  VHI. All rights reserved.   Abduction: Clam (Eccentric) - Side-Lying    Lie on side with knees bent. Lift top knee, keeping feet together. Keep trunk steady. Slowly lower for 3-5 seconds. _10__ reps per set, _2__ sets per day, _5-7__ days per week. Make sure that your hip stays forward, don't let your trunk/hips rotate backwards.    http://ecce.exer.us/65   Copyright  VHI. All rights reserved.   Bridging    Slowly raise buttocks from floor, keeping stomach tight.  Do these slowly! Repeat __10__ times per set. Do __1__ sets per session. Do _2___ sessions per day.  http://orth.exer.us/1097   Copyright  VHI. All rights reserved.   Quadruped Arm / Leg Lift (Alternating)    Kneel on a soft surface to cushion knees. Place pillow under sound foot and shin. Raise opposite arm and leg. Do not arch neck or back. Repeat on other side. Hold __3__ seconds each side. Repeat __10__ times. Do __2__ sessions per day.  Copyright  VHI. All rights reserved.

## 2016-07-28 ENCOUNTER — Telehealth: Payer: Self-pay | Admitting: Rehabilitation

## 2016-07-28 NOTE — Telephone Encounter (Signed)
Dr. Marjory LiesPenumalli,   I evaluated Providence LaniusXiaoying Eagon on Monday at OP neuro for PT.  Note pt and dad report new onset of tremors in hands and weakness.  Feel that she could benefit from OT consult, please write order if you agree and we will schedule on next visit.    Thanks, Harriet ButteEmily Isabellamarie Randa, PT, MPT [facility] [address] [place], [place], [address] Phone: [phone]   Fax:  [phone] 07/28/16, 11:46 AM

## 2016-07-28 NOTE — Therapy (Signed)
[facility] [address] [place], [place], [address] Phone: [phone]   Fax:  [phone]  Physical Therapy Evaluation  Patient Details  Name: Elizabeth Cantrell MRN: [id number] Date of Birth: 04/09/1991 Referring Provider: Andrey Spearman, MD  Encounter Date: 07/26/2016      PT End of Session - 07/28/16 1149    Visit Number 1   Number of Visits 9   Date for PT Re-Evaluation 08/27/16   Authorization Type BCBS 1080 deduct.    PT Start Time 1315   PT Stop Time 1400   PT Time Calculation (min) 45 min   Activity Tolerance Patient tolerated treatment well   Behavior During Therapy WFL for tasks assessed/performed      Past Medical History:  Diagnosis Date  . Acne   . Acquired acanthosis nigricans   . Amenorrhea    until beginning glucophage  . ANA positive   . 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 Testing for Rett Syndrome normal.  . Fractured elbow    x 2 with falls  . Heavy periods   . Hirsutism    with elevated free testosterone levels  . Hyperinsulinism   . Hypoglycemia    with seizures that seem to respond to glucose  . Nosebleed    with abnormal clotting studies  . Seizure disorder ([place])   . Seizures (HCC)    hx of  . Striae   . Tachycardia   . 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 No pertinent surgical history.  There were no vitals filed for this visit.           [facility] PT Assessment - 07/28/16 0001      Assessment   Medical Diagnosis gait instability   Onset Date/Surgical Date --  Notes change in past two years, no therapy in past two years   Hand Dominance Right   Prior Therapy PT/OT during high school     Precautions   Precautions Fall     Restrictions   Weight Bearing Restrictions No     Home Environment   Living Environment Private residence   Living Arrangements Parent   Available Help at Discharge Available 24 hours/day  Father works from [employer] to enter   Entrance Stairs-Number of Steps 1   [place] Two level;Able to live on main level with bedroom/bathroom   Alternate Level Stairs-Number of Steps 14   Alternate Level Stairs-Rails Right   Home Equipment Grab bars - toilet  tub/shower, sits to take bath     Prior Function   Level of Independence Independent with basic ADLs  doesn't cook/clean, has elastic shoe laces   Leisure plays baseball, plays tennis     Cognition   Overall Cognitive Status History of cognitive impairments - at baseline     Sensation   Light Touch Appears Intact   Hot/Cold Appears Intact   Proprioception Appears Intact     Coordination   Gross Motor Movements are Fluid and Coordinated Yes   Fine Motor Movements are Fluid and Coordinated Yes   Heel Shin Test decreased excursion, feel like may be due to decreased strength and cognitive ability.      Posture/Postural Control   Posture/Postural Control Postural limitations     Strength  Overall Strength Deficits   Overall Strength Comments B hip flex 2/5, L knee ext 4/5, R knee ext 3+/5, L knee flex 3+/5, R knee flex 2/5, B ankle strength 3/5   Feel that some deficits likely due to cognitive impairments.     Transfers   Transfers Sit to Stand;Stand to Sit   Sit to Stand 7: Independent   Five time sit to stand comments  14.78 secs without UE support   Stand to Sit 7: Independent     Ambulation/Gait   Ambulation/Gait Yes   Ambulation/Gait Assistance 6: Modified independent (Device/Increase time)   Ambulation Distance (Feet) 400 Feet   Assistive device None   Gait Pattern Step-through pattern;Decreased stride length;Right foot flat;Left foot flat;Lateral hip instability;Decreased trunk rotation;Wide base of support   Gait velocity 2.42 ft/sec   Stairs Yes   Stairs  Assistance 5: Supervision   Stair Management Technique One rail Right   Number of Stairs 4   Height of Stairs 6     Standardized Balance Assessment   Standardized Balance Assessment Dynamic Gait Index     Dynamic Gait Index   Level Surface Mild Impairment   Change in Gait Speed Moderate Impairment   Gait with Horizontal Head Turns Moderate Impairment   Gait with Vertical Head Turns Moderate Impairment   Gait and Pivot Turn Mild Impairment   Step Over Obstacle Mild Impairment   Step Around Obstacles Mild Impairment   Steps Mild Impairment   Total Score 13   DGI comment: Scores under 19 indicate increased fall risk.                            PT Education - 07/28/16 1149    Education provided Yes   Education Details Evaluation findings, POC, goals   Person(s) Educated Patient;Parent(s)   Methods Explanation;Demonstration;Handout   Comprehension Verbalized understanding;Returned demonstration          PT Short Term Goals - 07/28/16 1155      PT SHORT TERM GOAL #1   Title =LTG's           PT Long Term Goals - 07/28/16 1155      PT LONG TERM GOAL #1   Title Pt will be independent with HEP in order to indicate improved functional mobility and decreased fall risk. (Target Date: 08/23/16)   Time 4   Period Weeks   Status New     PT LONG TERM GOAL #2   Title Pt will improve DGI score to >19/24 in order to indicate decreased fall risk.     Time 4   Period Weeks   Status New     PT LONG TERM GOAL #3   Title Will assess 6MWT and improve distance by 150' in order to indicate improved functional endurance.     Time 4   Period Weeks   Status New     PT LONG TERM GOAL #4   Title Pt will improve overall hip strength to 3+/5 in order to indicate improved functional strength.     Time 4   Period Weeks   Status New     PT LONG TERM GOAL #5   Title Pt will report being able to walk for 45 mins at a time without need to sit and rest in order to  indicate improved functional endurance.     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  She is now demonstrating increased weakness, endurance and balance, beginning approx 2 years ago since ending PT/OT in school setting.  Per father, she is unable to walk more than 30 mins without sitting and is unable to do more things around the house.  Upon PT evaluation, note marked weakness in BLEs (esp hips), gait speed of 2.42 ft/sec, indicative of limited community ambulation and DGI score of 13/24 indicative of increased fall risk.  She tends to ambulate without any core activation and keeps hips locked on ligaments.  Pt is of evolving presentation and moderate complexity from PT POC stand point.  Pt will benefit from skilled OP neuro PT in order to address deficits.     Rehab Potential Good   Clinical Impairments Affecting Rehab Potential medical history   PT Frequency 2x / week   PT Duration 4 weeks   PT Treatment/Interventions ADLs/Self Care Home Management;DME Instruction;Gait training;Stair training;Functional mobility training;Therapeutic activities;Therapeutic exercise;Balance training;Neuromuscular re-education;Patient/family education;Energy conservation;Vestibular   PT Next Visit Plan 6MWT, check compliance with HEP given on eval-add as able, add balance, (make sure pt-esp if mom present) understands they have a deductible they haven't met yet)   Consulted and Agree with Plan of Care Patient;Family member/caregiver   Family Member Consulted father      Patient will benefit from skilled therapeutic intervention in order to improve the following deficits and impairments:  Abnormal gait, Decreased activity tolerance, Decreased balance, Decreased coordination, Decreased endurance, Decreased mobility, Decreased strength, Impaired perceived  functional ability, Impaired flexibility, Improper body mechanics, Postural dysfunction  Visit Diagnosis: Muscle weakness (generalized) - Plan: PT plan of care cert/re-cert  Unsteadiness on feet - Plan: PT plan of care cert/re-cert  Other abnormalities of gait and mobility - Plan: PT plan of care cert/re-cert     Problem List Patient Active Problem List   Diagnosis Date Noted  . Sleepiness 02/12/2015  . Sleep trouble 02/12/2015  . Medication management 02/12/2015  . Hyperinsulinemia 02/12/2015  . PCOS (polycystic ovarian syndrome) 08/14/2014  . Thyroiditis, autoimmune ? 08/14/2014  . Iron deficiency 05/30/2013  . Visit for preventive health examination 05/30/2013  . Hair thinning 01/09/2012  . Acne 01/09/2012  . Goiter 07/04/2011  . Prediabetes 06/16/2011  . Fatigue 06/16/2011  . Anemia 06/16/2011  . Developmental delay disorder 06/16/2011  . Acanthosis nigricans 06/16/2011  . Tachycardia 06/16/2011  . ANA positive   . Thoracic scoliosis     Cameron Sprang, PT, MPT [facility] [address] [place], [place], [address] Phone: [phone]   Fax:  [phone] 07/28/16, 12:01 PM  Name: Elizabeth Cantrell MRN: [id number] Date of Birth: [birth year]

## 2016-08-02 ENCOUNTER — Ambulatory Visit: Payer: BC Managed Care – PPO | Admitting: Physical Therapy

## 2016-08-02 DIAGNOSIS — R2681 Unsteadiness on feet: Secondary | ICD-10-CM

## 2016-08-02 DIAGNOSIS — M6281 Muscle weakness (generalized): Secondary | ICD-10-CM | POA: Diagnosis not present

## 2016-08-02 DIAGNOSIS — R2689 Other abnormalities of gait and mobility: Secondary | ICD-10-CM

## 2016-08-02 NOTE — Patient Instructions (Signed)
Hip Flexion / Knee Extension: Straight-Leg Raise (Eccentric)    Lie on back. Lift leg with knee straight. Slowly lower leg for 3-5 seconds. _5__ reps per set (on each side) , _2__ sets per day, __5-7_ days per week. Lower like elevator, stopping at each floor.   Abduction: Clam (Eccentric) - Side-Lying    Lie on side with knees bent. Lift top knee, keeping feet together. Keep trunk steady. Slowly lower for 3-5 seconds. _10__ reps per set, _2__ sets per day, _5-7__ days per week. Make sure that your hip stays forward, don't let your trunk/hips rotate backwards.     Bridging    Slowly raise buttocks from floor, keeping stomach tight.  Do these slowly! Repeat __10__ times per set. Do __1__ sets per session. Do _2___ sessions per day.    Bracing With Leg Raise (Quadruped)    On hands and knees find neutral spine. Tighten pelvic floor and abdominals and hold. Alternating legs, straighten and lift to hip level. Hold 3 seconds, then slowly return to resting position. Repeat _12__ times total. Do _1-2__ times a day. Copyright  VHI. All rights reserved.

## 2016-08-02 NOTE — Therapy (Signed)
[facility] [address] [place], [place], [address] Phone: [phone]   Fax:  [phone]  Physical Therapy Treatment  Patient Details  Name: Elizabeth Cantrell MRN: [id number] Date of Birth: 1991/07/09 Referring Provider: Joycelyn Schmid, MD  Encounter Date: 08/02/2016      PT End of Session - 08/02/16 1225    Visit Number 2   Number of Visits 9   Date for PT Re-Evaluation 09/24/16  updated POC since only 1x/wk for 8 weeks   Authorization Type BCBS 1080 deduct.    PT Start Time 1017   PT Stop Time 1100   PT Time Calculation (min) 43 min   Activity Tolerance Patient tolerated treatment well   Behavior During Therapy WFL for tasks assessed/performed      Past Medical History:  Diagnosis Date  . Acne   . Acquired acanthosis nigricans   . Amenorrhea    until beginning glucophage  . ANA positive   . 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 Testing for Rett Syndrome normal.  . Fractured elbow    x 2 with falls  . Heavy periods   . Hirsutism    with elevated free testosterone levels  . Hyperinsulinism   . Hypoglycemia    with seizures that seem to respond to glucose  . Nosebleed    with abnormal clotting studies  . Seizure disorder (HCC)   . Seizures ([facility])    hx of  . Striae   . Tachycardia   . 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  There were no vitals filed for this visit.      Subjective Assessment - 08/02/16 1024    Subjective Per father, "She has gotten weaker and weak. Cannot walk more than 30 minutes." Pt denies falls, significant changes.   Patient is accompained by: Family member  father   Limitations Walking   Currently in Pain? No/denies            [facility] PT Assessment - 08/02/16 0001      6 Minute walk- Post Test   6 Minute Walk Post Test  --   Modified Borg Scale for Dyspnea 0- Nothing at all   Perceived Rate of Exertion (Borg) 7- Very, very light     6 minute walk test results    Aerobic Endurance Distance Walked 1105                     [facility] Adult PT Treatment/Exercise - 08/02/16 0001      Ambulation/Gait   Ambulation/Gait Yes   Ambulation/Gait Assistance 6: Modified independent (Device/Increase time);5: Supervision   Ambulation/Gait Assistance Details Gait 2 x130' with mod I for stability/balance, (S)/cueing for decreased posterior pelvic tilt, increased anterior weight shift onto respective LE during weight acceptance. Cued pt for gait with BUE support at rolling tray table at approx. waiste-level to promote sustained alignment (decreased posterior preference).   Ambulation Distance (Feet) 1365 Feet  2 x130' + 1,105' during   Assistive device None   Gait Pattern Step-through pattern;Decreased stride length;Right foot flat;Left foot flat;Lateral hip instability;Decreased trunk rotation;Wide base of support  posterior pelvic tilt; posterior trunk lean   Ambulation Surface Level;Indoor     Exercises   Exercises Other Exercises   Other Exercises  Supine: SLR x5 reps per side (to  pt fatigue) with verbal/tactile cueing for technique (quadriceps control) on L; supine bridging x10 reps with effective between-session carryover of technique;  SL clamshell x10 reps per side with cueing for alignment, technique. Attempted quadruped alternating reciprocal UE/LE elevation; however, pt with decreased stability, increased compensatory trunk rotation; therefore, modfiied to quadruped alternating LE extension x12 reps. Seated on blue physioball (min guard-min A for stability/balance), pt performed anterior/posterior pelvic tilt x5 reps per direction with cueing for PT for increased awareness of pelvic position; progressed to anterior pelvic tilt with concurrent alternating LE flexion x10 reps per side to promote more  normalized pelvic alignment with standing/ambulation.                PT Education - 08/02/16 1641    Education provided Yes   Education Details Reviewed/modified HEP to increase pt independence, improve quality of movement. Impact of pelvic tilt, weight shift during ambulation. Modified PT POC (due to financial limitations), per request of pt's father.   Person(s) Educated Patient;Parent(s)  father   Methods Explanation;Demonstration;Handout;Verbal cues   Comprehension Verbalized understanding;Returned demonstration          PT Short Term Goals - 07/28/16 1155      PT SHORT TERM GOAL #1   Title =LTG's           PT Long Term Goals - 08/02/16 1655      PT LONG TERM GOAL #1   Title Pt will be independent with HEP in order to indicate improved functional mobility and decreased fall risk. (Target Date: 08/23/16)   Time 4   Period Weeks   Status On-going     PT LONG TERM GOAL #2   Title Pt will improve DGI score to >19/24 in order to indicate decreased fall risk.     Time 4   Period Weeks   Status On-going     PT LONG TERM GOAL #3   Title Will assess 6MWT and improve distance by 150' in order to indicate improved functional endurance.     Baseline 10/9: baseline 6MWT distance = 1,105'   Time 4   Period Weeks   Status On-going     PT LONG TERM GOAL #4   Title Pt will improve overall hip strength to 3+/5 in order to indicate improved functional strength.     Time 4   Period Weeks   Status On-going     PT LONG TERM GOAL #5   Title Pt will report being able to walk for 45 mins at a time without need to sit and rest in order to indicate improved functional endurance.     Time 4   Period Weeks   Status On-going               Plan - 08/02/16 1227    Clinical Impression Statement Session focused on reviewing HEP, completing 6MWT, and gait training. Pt tolerated interventions well and described no fatigue or SOB post-6MWT. Will require reinforcement of  both HEP and cueing to decrease posterior preference during gait. Modified POC from 2x/week to 1x/week, per father's request, due to insurance limitations.    Rehab Potential Good   Clinical Impairments Affecting Rehab Potential medical history   PT Frequency 1x / week  updated POC due to financial restriction   PT Duration 8 weeks  1x/wk for 8 weeks due to financial restriction   PT Treatment/Interventions ADLs/Self Care Home Management;DME Instruction;Gait training;Stair training;Functional mobility training;Therapeutic activities;Therapeutic exercise;Balance training;Neuromuscular re-education;Patient/family education;Energy conservation;Vestibular   PT  Next Visit Plan Continue gait training, core/LE strengthening, closed-chain/WB   Consulted and Agree with Plan of Care Patient;Family member/caregiver   Family Member Consulted father      Patient will benefit from skilled therapeutic intervention in order to improve the following deficits and impairments:  Abnormal gait, Decreased activity tolerance, Decreased balance, Decreased coordination, Decreased endurance, Decreased mobility, Decreased strength, Impaired perceived functional ability, Impaired flexibility, Improper body mechanics, Postural dysfunction  Visit Diagnosis: Muscle weakness (generalized) - Plan: PT plan of care cert/re-cert  Unsteadiness on feet - Plan: PT plan of care cert/re-cert  Other abnormalities of gait and mobility - Plan: PT plan of care cert/re-cert     Problem List Patient Active Problem List   Diagnosis Date Noted  . Sleepiness 02/12/2015  . Sleep trouble 02/12/2015  . Medication management 02/12/2015  . Hyperinsulinemia 02/12/2015  . PCOS (polycystic ovarian syndrome) 08/14/2014  . Thyroiditis, autoimmune ? 08/14/2014  . Iron deficiency 05/30/2013  . Visit for preventive health examination 05/30/2013  . Hair thinning 01/09/2012  . Acne 01/09/2012  . Goiter 07/04/2011  . Prediabetes 06/16/2011   . Fatigue 06/16/2011  . Anemia 06/16/2011  . Developmental delay disorder 06/16/2011  . Acanthosis nigricans 06/16/2011  . Tachycardia 06/16/2011  . ANA positive   . Thoracic scoliosis     Jorje Guild, PT, DPT [facility] [address] [place], [place], [address] Phone: [phone]   Fax:  [phone] 08/02/16, 4:57 PM  Name: Derrika Ruffalo MRN: [id number] Date of Birth: [birth year]

## 2016-08-06 ENCOUNTER — Ambulatory Visit: Payer: BC Managed Care – PPO | Admitting: Physical Therapy

## 2016-08-10 ENCOUNTER — Ambulatory Visit: Payer: BC Managed Care – PPO | Admitting: Physical Therapy

## 2016-08-12 ENCOUNTER — Encounter: Payer: Self-pay | Admitting: Rehabilitation

## 2016-08-12 ENCOUNTER — Ambulatory Visit: Payer: BC Managed Care – PPO | Admitting: Rehabilitation

## 2016-08-12 DIAGNOSIS — R2681 Unsteadiness on feet: Secondary | ICD-10-CM

## 2016-08-12 DIAGNOSIS — M6281 Muscle weakness (generalized): Secondary | ICD-10-CM

## 2016-08-12 DIAGNOSIS — R2689 Other abnormalities of gait and mobility: Secondary | ICD-10-CM

## 2016-08-12 NOTE — Patient Instructions (Signed)
Bracing With Arm / Leg Raise (Quadruped)    On hands and knees find neutral spine. Tighten pelvic floor and abdominals and hold. Alternating, lift arm to shoulder level and opposite leg to hip level. Repeat _5__ times on each side. Do _1-2__ times a day.   Copyright  VHI. All rights reserved.

## 2016-08-12 NOTE — Therapy (Signed)
[facility] [address] [place], [place], [address] Phone: [phone]   Fax:  [phone]  Physical Therapy Treatment  Patient Details  Name: Elizabeth LaniusXiaoying Cantrell MRN: [id number] Date of Birth: 1991-10-14 Referring Provider: Joycelyn SchmidVikram Penumalli, MD  Encounter Date: 08/12/2016      PT End of Session - 08/12/16 1700    Visit Number 3   Number of Visits 9   Date for PT Re-Evaluation 09/24/16  updated POC since only 1x/wk for 8 weeks   Authorization Type BCBS 1080 deduct.    PT Start Time 1402   PT Stop Time 1445   PT Time Calculation (min) 43 min   Activity Tolerance Patient tolerated treatment well   Behavior During Therapy WFL for tasks assessed/performed      Past Medical History:  Diagnosis Date  . Acne   . Acquired acanthosis nigricans   . Amenorrhea    until beginning glucophage  . ANA positive   . 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 Testing for Rett Syndrome normal.  . Fractured elbow    x 2 with falls  . Heavy periods   . Hirsutism    with elevated free testosterone levels  . Hyperinsulinism   . Hypoglycemia    with seizures that seem to respond to glucose  . Nosebleed    with abnormal clotting studies  . Seizure disorder (HCC)   . Seizures (HCC)    hx of  . Striae   . Tachycardia   . 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 No pertinent surgical history.  There were no vitals filed for this visit.      Subjective Assessment - 08/12/16 1408    Subjective Reports no changes, no falls.  Still walking up to 30 mins.     Patient is accompained by: Family member   Limitations Walking   Currently in Pain? No/denies       TE:  Quadruped alternating LEs x 5 reps, quadruped alternating UE/LEs x 10 reps.  Noted improvement in ability to maintain alignment therefore added to  HEP with encouragement to do alternating LEs followed by rest break then continuing.  Performed physioball sitting while marching x 10 reps alternating cone taps for target with tactile and verbal cues for upright posture.  Progressed to scapular retraction with 90 deg shoulder flexion using yellow band while on physioball x 10 reps, 2 sets.  Again, light tactile cues for posture and improved pelvic tilt.  Standing heel/toe raises x 10 reps, standing hip extension x 10 reps, both with cues for posture.  Seated nustep with BUEs/LEs x 3 mins at level 3 resistance for overall strength and endurance.  Tolerated well.    Gait:  Gait with use of rolling table in order to shift weight forward x 500' at min A level (for facilitation at pelvis for posterior tilt and at trunk for posture).  Pt does well with use of table and cues for improved heel to toe contact during initial contact, however note that when gait training following use of table, no carryover present and pt tends to increase gait speed.  Ambulated another 300' without table with min A (increased facilitation at pelvis), again with no carryover without facilitation.  PT Education - 08/12/16 1409    Education provided Yes   Education Details additions to [employer]) Educated Patient   Methods Explanation   Comprehension Verbalized understanding          PT Short Term Goals - 07/28/16 1155      PT SHORT TERM GOAL #1   Title =LTG's           PT Long Term Goals - 08/02/16 1655      PT LONG TERM GOAL #1   Title Pt will be independent with HEP in order to indicate improved functional mobility and decreased fall risk. (Target Date: 08/23/16)   Time 4   Period Weeks   Status On-going     PT LONG TERM GOAL #2   Title Pt will improve DGI score to >19/24 in order to indicate decreased fall risk.     Time 4   Period Weeks   Status On-going     PT LONG TERM GOAL #3   Title Will  assess and improve distance by 150' in order to indicate improved functional endurance.     Baseline 10/9: baseline distance = 1,105'   Time 4   Period Weeks   Status On-going     PT LONG TERM GOAL #4   Title Pt will improve overall hip strength to 3+/5 in order to indicate improved functional strength.     Time 4   Period Weeks   Status On-going     PT LONG TERM GOAL #5   Title Pt will report being able to walk for 45 mins at a time without need to sit and rest in order to indicate improved functional endurance.     Time 4   Period Weeks   Status On-going               Plan - 08/12/16 1700    Clinical Impression Statement Skilled session focused on brief review of HEP with addition of single exercise, continuing to address core strength and carryover to gait.  She continues to demonstrate posterior preference during gait following all exercise and gait training.     Rehab Potential Good   Clinical Impairments Affecting Rehab Potential medical history   PT Frequency 1x / week  updated POC due to financial restriction   PT Duration 8 weeks  1x/wk for 8 weeks due to financial restriction   PT Treatment/Interventions ADLs/Self Care Home Management;DME Instruction;Gait training;Stair training;Functional mobility training;Therapeutic activities;Therapeutic exercise;Balance training;Neuromuscular re-education;Patient/family education;Energy conservation;Vestibular   PT Next Visit Plan Continue gait training, core/LE strengthening, closed-chain/WB   Consulted and Agree with Plan of Care Patient;Family member/caregiver   Family Member Consulted father      Patient will benefit from skilled therapeutic intervention in order to improve the following deficits and impairments:  Abnormal gait, Decreased activity tolerance, Decreased balance, Decreased coordination, Decreased endurance, Decreased mobility, Decreased strength, Impaired perceived functional ability, Impaired  flexibility, Improper body mechanics, Postural dysfunction  Visit Diagnosis: Muscle weakness (generalized)  Unsteadiness on feet  Other abnormalities of gait and mobility     Problem List Patient Active Problem List   Diagnosis Date Noted  . Sleepiness 02/12/2015  . Sleep trouble 02/12/2015  . Medication management 02/12/2015  . Hyperinsulinemia 02/12/2015  . PCOS (polycystic ovarian syndrome) 08/14/2014  . Thyroiditis, autoimmune ? 08/14/2014  . Iron deficiency 05/30/2013  . Visit for preventive health examination 05/30/2013  . Hair thinning 01/09/2012  . Acne 01/09/2012  . Goiter 07/04/2011  .  Prediabetes 06/16/2011  . Fatigue 06/16/2011  . Anemia 06/16/2011  . Developmental delay disorder 06/16/2011  . Acanthosis nigricans 06/16/2011  . Tachycardia 06/16/2011  . ANA positive   . Thoracic scoliosis     Vista Deck 08/12/2016, 5:03 PM  [place] [facility] [address] [place], [place], [address] Phone: [phone]   Fax:  [phone]  Name: Xitlally Mooneyham MRN: [id number] Date of Birth: 1991-04-29

## 2016-08-16 ENCOUNTER — Other Ambulatory Visit: Payer: Self-pay | Admitting: Internal Medicine

## 2016-08-16 ENCOUNTER — Ambulatory Visit: Payer: BC Managed Care – PPO | Admitting: Rehabilitation

## 2016-08-18 NOTE — Telephone Encounter (Signed)
Ok to come back for yearly CPX om May 18   Can refill  Until then

## 2016-08-18 NOTE — Telephone Encounter (Signed)
Pt was to return in 6-12 months.  Depending on labs.  Please advise.

## 2016-08-18 NOTE — Telephone Encounter (Signed)
Sent to the pharmacy by e-scribe. 

## 2016-08-20 ENCOUNTER — Encounter: Payer: Self-pay | Admitting: Rehabilitation

## 2016-08-20 ENCOUNTER — Ambulatory Visit: Payer: BC Managed Care – PPO | Admitting: Rehabilitation

## 2016-08-20 DIAGNOSIS — R2681 Unsteadiness on feet: Secondary | ICD-10-CM

## 2016-08-20 DIAGNOSIS — M6281 Muscle weakness (generalized): Secondary | ICD-10-CM

## 2016-08-20 DIAGNOSIS — R2689 Other abnormalities of gait and mobility: Secondary | ICD-10-CM

## 2016-08-20 NOTE — Therapy (Signed)
[facility] [address] [place], [place], [address] Phone: [phone]   Fax:  [phone]  Physical Therapy Treatment  Patient Details  Name: Elizabeth Cantrell MRN: [id number] Date of Birth: [birth year] Referring Provider: Joycelyn Schmid, MD  Encounter Date: 08/20/2016      PT End of Session - 08/20/16 1912    Visit Number 4   Number of Visits 9   Date for PT Re-Evaluation 09/24/16  updated POC since only 1x/wk for 8 weeks   Authorization Type BCBS 1080 deduct.    PT Start Time 1405   PT Stop Time 1447   PT Time Calculation (min) 42 min   Activity Tolerance Patient tolerated treatment well   Behavior During Therapy WFL for tasks assessed/performed      Past Medical History:  Diagnosis Date  . Acne   . Acquired acanthosis nigricans   . Amenorrhea    until beginning glucophage  . ANA positive   . 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 Testing for Rett Syndrome normal.  . Fractured elbow    x 2 with falls  . Heavy periods   . Hirsutism    with elevated free testosterone levels  . Hyperinsulinism   . Hypoglycemia    with seizures that seem to respond to glucose  . Nosebleed    with abnormal clotting studies  . Seizure disorder (HCC)   . Seizures (HCC)    hx of  . Striae   . Tachycardia   . 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 No pertinent surgical history.  There were no vitals filed for this visit.      Subjective Assessment - 08/20/16 1411    Subjective Reports no changes, no falls.  Exercises are "tiring."    Patient is accompained by: Family member   Limitations Walking   Currently in Pain? No/denies                 TE:  Continue to work on core strength with semi reclined sit ups with 3lb medicine ball.  Note pt with increased strain in neck,  therefore provided cues for alignment and increased abdominal activation.  Continued to work on core strength with isometric contraction by pushing into physioball x 15 reps with 3 second hold.  Note pt tends to perform a few exercises at a time and needs encouragement to continue with exercise.  Performed trunk rotation in hooklying while passing 2lb medicine ball in order to active oblique abdominal muscles.  Again, needs encouragement to continue with task, performed x 15 reps.  Seated nustep with BUEs/LEs for endurance and overall strengthening x 5 mins (with single rest break) at level 4 resistance and cues to maintain steps per min in the 40s.    Self Care:  Discussed with mother getting pt involved in day/community program in which she can get out socially but also work on machines for strength and endurance.  PT to search for options as well in preparation for next visit.  Mother verbalized understanding.           [facility] Adult PT Treatment/Exercise - 08/20/16 1430      Ambulation/Gait   Ambulation/Gait Yes   Ambulation/Gait Assistance 5: Supervision;4: Min assist  min A for facilitation   Ambulation/Gait Assistance Details Performed gait outdoors over paved and grassy surfaces x 500'  with S to min A.  Provided min A to increase forward trunk lean, which is corrected with facilitation but pt unable to maintain without facilitation.     Ambulation Distance (Feet) 500 Feet   Assistive device None   Gait Pattern Step-through pattern;Decreased stride length;Right foot flat;Left foot flat;Lateral hip instability;Decreased trunk rotation;Wide base of support   Ambulation Surface Level;Unlevel;Indoor;Outdoor;Paved;Grass                PT Education - 08/20/16 1911    Education provided Yes   Education Details Education about pt being more active in community (PT to look for community fitness options), also encouraged pt to perform exercises consecutively to increase activity tolerance.      Person(s) Educated Patient;Parent(s)   Methods Explanation   Comprehension Verbalized understanding          PT Short Term Goals - 07/28/16 1155      PT SHORT TERM GOAL #1   Title =LTG's           PT Long Term Goals - 08/20/16 1915      PT LONG TERM GOAL #1   Title Pt will be independent with HEP in order to indicate improved functional mobility and decreased fall risk. (Modified Target Date: 09/24/16)   Time 8   Period Weeks   Status On-going     PT LONG TERM GOAL #2   Title Pt will improve DGI score to >19/24 in order to indicate decreased fall risk.     Time 8   Period Weeks   Status On-going     PT LONG TERM GOAL #3   Title Will assess and improve distance by 150' in order to indicate improved functional endurance.     Baseline 10/9: baseline distance = 1,105'   Time 8   Period Weeks   Status On-going     PT LONG TERM GOAL #4   Title Pt will improve overall hip strength to 3+/5 in order to indicate improved functional strength.     Time 8   Period Weeks   Status On-going     PT LONG TERM GOAL #5   Title Pt will report being able to walk for 45 mins at a time without need to sit and rest in order to indicate improved functional endurance.     Time 8   Period Weeks   Status On-going               Plan - 08/20/16 1912    Clinical Impression Statement Skilled session focused on gait over outdoor surfaces to ensure safety as well as continued focus on core activation/strength to carry over to gait.  Discussed increasing activity at home and community.  Pt tends to perform single exercise and need a break, therefore encouraged her to perform as many exercises as she can at home before resting.     Rehab Potential Good   Clinical Impairments Affecting Rehab Potential medical history   PT Frequency 1x / week  updated POC due to financial restriction   PT Duration 8 weeks  1x/wk for 8 weeks due to financial restriction   PT  Treatment/Interventions ADLs/Self Care Home Management;DME Instruction;Gait training;Stair training;Functional mobility training;Therapeutic activities;Therapeutic exercise;Balance training;Neuromuscular re-education;Patient/family education;Energy conservation;Vestibular   PT Next Visit Plan Continue gait training, core/LE strengthening, closed-chain/WB   Consulted and Agree with Plan of Care Patient;Family member/caregiver   Family Member Consulted father      Patient will benefit from skilled therapeutic  intervention in order to improve the following deficits and impairments:  Abnormal gait, Decreased activity tolerance, Decreased balance, Decreased coordination, Decreased endurance, Decreased mobility, Decreased strength, Impaired perceived functional ability, Impaired flexibility, Improper body mechanics, Postural dysfunction  Visit Diagnosis: Muscle weakness (generalized)  Unsteadiness on feet  Other abnormalities of gait and mobility     Problem List Patient Active Problem List   Diagnosis Date Noted  . Sleepiness 02/12/2015  . Sleep trouble 02/12/2015  . Medication management 02/12/2015  . Hyperinsulinemia 02/12/2015  . PCOS (polycystic ovarian syndrome) 08/14/2014  . Thyroiditis, autoimmune ? 08/14/2014  . Iron deficiency 05/30/2013  . Visit for preventive health examination 05/30/2013  . Hair thinning 01/09/2012  . Acne 01/09/2012  . Goiter 07/04/2011  . Prediabetes 06/16/2011  . Fatigue 06/16/2011  . Anemia 06/16/2011  . Developmental delay disorder 06/16/2011  . Acanthosis nigricans 06/16/2011  . Tachycardia 06/16/2011  . ANA positive   . Thoracic scoliosis     Vista Deckarcell, Vanisha Whiten Ann 08/20/2016, 7:18 PM  [place] [facility] [address] [place], [place], [address] Phone: [phone]   Fax:  [phone]  Name: Providence LaniusXiaoying Koning MRN: [id number] Date of Birth: [birth year]

## 2016-08-23 ENCOUNTER — Ambulatory Visit: Payer: BC Managed Care – PPO | Admitting: Rehabilitation

## 2016-08-26 ENCOUNTER — Telehealth: Payer: Self-pay | Admitting: Rehabilitation

## 2016-08-26 ENCOUNTER — Ambulatory Visit: Payer: BC Managed Care – PPO | Attending: Diagnostic Neuroimaging | Admitting: Rehabilitation

## 2016-08-26 DIAGNOSIS — R251 Tremor, unspecified: Secondary | ICD-10-CM

## 2016-08-26 DIAGNOSIS — R2689 Other abnormalities of gait and mobility: Secondary | ICD-10-CM | POA: Diagnosis present

## 2016-08-26 DIAGNOSIS — M6281 Muscle weakness (generalized): Secondary | ICD-10-CM | POA: Diagnosis not present

## 2016-08-26 DIAGNOSIS — R2681 Unsteadiness on feet: Secondary | ICD-10-CM | POA: Diagnosis present

## 2016-08-26 NOTE — Telephone Encounter (Signed)
Dr. Marjory LiesPenumalli,   [facility]  Note pt and dad report new onset of tremors in hands and weakness.  Feel that she could benefit from OT consult, please write order if you agree and we will call to get her scheduled.  I am D/Cing her from PT as she is not demonstrating progress and feel that this is due to decreased carryover at home.    Thanks,  Elizabeth Cantrell, PT, MPT [facility] [address] [place], [place], [address] Phone: [phone]   Fax:  [phone] 08/26/16, 8:36 PM

## 2016-08-27 ENCOUNTER — Encounter: Payer: Self-pay | Admitting: Rehabilitation

## 2016-08-27 NOTE — Therapy (Signed)
[place] [address] [place], [place], [address] Phone: [phone]   Fax:  [phone]  Physical Therapy Treatment and D/C Summary  Patient Details  Name: Elizabeth Cantrell MRN: [id number] Date of Birth: July 22, 1991 Referring Provider: Andrey Spearman, MD  Encounter Date: 08/26/2016      PT End of Session - 08/26/16 1449    Visit Number 5   Number of Visits 9   Date for PT Re-Evaluation 09/24/16  updated POC since only 1x/wk for 8 weeks   Authorization Type [employer] 1080 deduct.    PT Start Time 1447   PT Stop Time 1530   PT Time Calculation (min) 43 min   Activity Tolerance Patient tolerated treatment well   Behavior During Therapy WFL for tasks assessed/performed      Past Medical History:  Diagnosis Date  . Acne   . Acquired acanthosis nigricans   . Amenorrhea    until beginning glucophage  . ANA positive   . 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 Testing for Rett Syndrome normal.  . Fractured elbow    x 2 with falls  . Heavy periods   . Hirsutism    with elevated free testosterone levels  . Hyperinsulinism   . Hypoglycemia    with seizures that seem to respond to glucose  . Nosebleed    with abnormal clotting studies  . Seizure disorder ([place])   . Seizures (HCC)    hx of  . Striae   . Tachycardia   . 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 No pertinent surgical history.  There were no vitals filed for this visit.      Subjective Assessment - 08/27/16 0809    Subjective Note that pt reports not doing exercises at home, however her father states that they are doing them.     Patient is accompained by: Family member   Limitations Walking   Currently in Pain? No/denies                         [facility] Adult PT Treatment/Exercise - 08/26/16 1457       Standardized Balance Assessment   Standardized Balance Assessment Dynamic Gait Index     Dynamic Gait Index   Level Surface Mild Impairment   Change in Gait Speed Moderate Impairment   Gait with Horizontal Head Turns Mild Impairment   Gait with Vertical Head Turns Mild Impairment   Gait and Pivot Turn Mild Impairment   Step Over Obstacle Mild Impairment   Step Around Obstacles Mild Impairment   Steps Mild Impairment   Total Score 15      NMR: See DGI results above.  Note 2 point improvement, however has not made functional difference in balance.    Self Care:  Had lengthy discussion with pt and father regarding progress in therapy and decreased carryover noted.  Feel that she is not fully compliant with HEP at home and likely not pushing into fatigue to increase endurance with walking program.  Educated that PT feels she will do better with continued work on [employer], walking program and possible participation with mother at gym (discussed with mom last session).  Pt and father verbalized understanding, however got conflicting information when asked about HEP.  Pt states not doing exercises however father states that they are doing  them at home.    TE:  Performed 6MWT with new distance of 838', a decline from baseline of 1105'.   Pt also needed single rest break today during testing.  Went over HEP with min cues for technique.  Performed BLE bridging x 10 reps, SL clam shell abduction x 10 reps each side, SLR x 10 reps BLE         PT Education - 08/27/16 0809    Education provided Yes   Education Details Education on DC from therapy as she is not making progress due to lack of compliance at home.  Encouraged her to be as active as possible and push into fatigue to improve endurance.     Person(s) Educated Patient;Parent(s)   Methods Explanation   Comprehension Verbalized understanding          PT Short Term Goals - 07/28/16 1155      PT SHORT TERM GOAL #1   Title =LTG's            PT Long Term Goals - 08/26/16 1457      PT LONG TERM GOAL #1   Title Pt will be independent with HEP in order to indicate improved functional mobility and decreased fall risk. (Modified Target Date: 09/24/16)   Baseline Requires cues from PT/father for exercises   Time 8   Period Weeks   Status Partially Met     PT LONG TERM GOAL #2   Title Pt will improve DGI score to >19/24 in order to indicate decreased fall risk.     Baseline 15/24 on 08/26/16   Time 8   Period Weeks   Status Partially Met     PT LONG TERM GOAL #3   Title Will assess 6MWT and improve distance by 150' in order to indicate improved functional endurance.     Baseline 10/9: baseline 6MWT distance = 1,105', 838' on 08/26/16   Time 8   Period Weeks   Status Not Met     PT LONG TERM GOAL #4   Title Pt will improve overall hip strength to 3+/5 in order to indicate improved functional strength.     Baseline 2-/5 on 08/26/16   Time 8   Period Weeks   Status Not Met     PT LONG TERM GOAL #5   Title Pt will report being able to walk for 45 mins at a time without need to sit and rest in order to indicate improved functional endurance.     Baseline states that she doesn't not increase time   Time 8   Period Weeks   Status Not Met               Plan - 08/26/16 1455    Clinical Impression Statement Had long discussion with pt and father regarding compliance with HEP at home and increasing walking program.  PT getting conflicting stories as pt states she is not doing exercises and father reports that she is.  Began to look at [place] as PT feels she is not making porgress and this is likely not appropriate setting for pt as she can continue to work on HEP at home with parents.  Pt has partially met 2/5 LTGs and did not meet remaining goals.  Note that 6MWT distance actually regressed quite a bit.  Continue to encourage increased activity at home.  Both verbalized understanding to D/C from therapy.     Rehab  Potential Good   Clinical Impairments Affecting Rehab Potential  medical history   PT Frequency 1x / week  updated POC due to financial restriction   PT Duration 8 weeks  1x/wk for 8 weeks due to financial restriction   PT Treatment/Interventions ADLs/Self Care Home Management;DME Instruction;Gait training;Stair training;Functional mobility training;Therapeutic activities;Therapeutic exercise;Balance training;Neuromuscular re-education;Patient/family education;Energy conservation;Vestibular   PT Next Visit Plan n/a   Consulted and Agree with Plan of Care Patient;Family member/caregiver   Family Member Consulted father      Patient will benefit from skilled therapeutic intervention in order to improve the following deficits and impairments:  Abnormal gait, Decreased activity tolerance, Decreased balance, Decreased coordination, Decreased endurance, Decreased mobility, Decreased strength, Impaired perceived functional ability, Impaired flexibility, Improper body mechanics, Postural dysfunction  Visit Diagnosis: Muscle weakness (generalized)  Unsteadiness on feet  Other abnormalities of gait and mobility  PHYSICAL THERAPY DISCHARGE SUMMARY  Visits from Start of Care: 5  Current functional level related to goals / functional outcomes:  See LTG status   Remaining deficits: Continues to demonstrate generalized weakness and poor endurance.     Education / Equipment: HEP and walking program.    Plan: Patient agrees to discharge.  Patient goals were not met. Patient is being discharged due to lack of progress.  ?????        Problem List Patient Active Problem List   Diagnosis Date Noted  . Sleepiness 02/12/2015  . Sleep trouble 02/12/2015  . Medication management 02/12/2015  . Hyperinsulinemia 02/12/2015  . PCOS (polycystic ovarian syndrome) 08/14/2014  . Thyroiditis, autoimmune ? 08/14/2014  . Iron deficiency 05/30/2013  . Visit for preventive health examination 05/30/2013   . Hair thinning 01/09/2012  . Acne 01/09/2012  . Goiter 07/04/2011  . Prediabetes 06/16/2011  . Fatigue 06/16/2011  . Anemia 06/16/2011  . Developmental delay disorder 06/16/2011  . Acanthosis nigricans 06/16/2011  . Tachycardia 06/16/2011  . ANA positive   . Thoracic scoliosis     Cameron Sprang, PT, MPT [facility] [address] [place], [place], [address] Phone: [phone]   Fax:  [phone] 08/27/16, 8:21 AM  Name: Alia Parsley MRN: [id number] Date of Birth: November 04, 1990

## 2016-08-30 ENCOUNTER — Ambulatory Visit: Payer: BC Managed Care – PPO | Admitting: Rehabilitation

## 2016-09-02 ENCOUNTER — Ambulatory Visit: Payer: BC Managed Care – PPO | Admitting: Rehabilitation

## 2016-09-06 ENCOUNTER — Other Ambulatory Visit: Payer: Self-pay | Admitting: Internal Medicine

## 2016-09-07 ENCOUNTER — Other Ambulatory Visit: Payer: Self-pay | Admitting: Internal Medicine

## 2016-09-08 NOTE — Telephone Encounter (Signed)
Denied.  Filled on 08/18/16 for 6 months.  Message sent back to the pharmacy to check the patient's file.

## 2016-10-01 ENCOUNTER — Other Ambulatory Visit: Payer: Self-pay | Admitting: Internal Medicine

## 2016-10-04 NOTE — Telephone Encounter (Signed)
Sent in on 08/18/16 for 6 months.  Message sent to the pharmacy to check file.

## 2016-10-18 IMAGING — DX DG CHEST 2V
2 series · 2 of 2 positions shown · non-contrast
Comparison: [DATE]

CLINICAL DATA: Cough and fever

EXAM:
CHEST  2 VIEW

[chest pa]
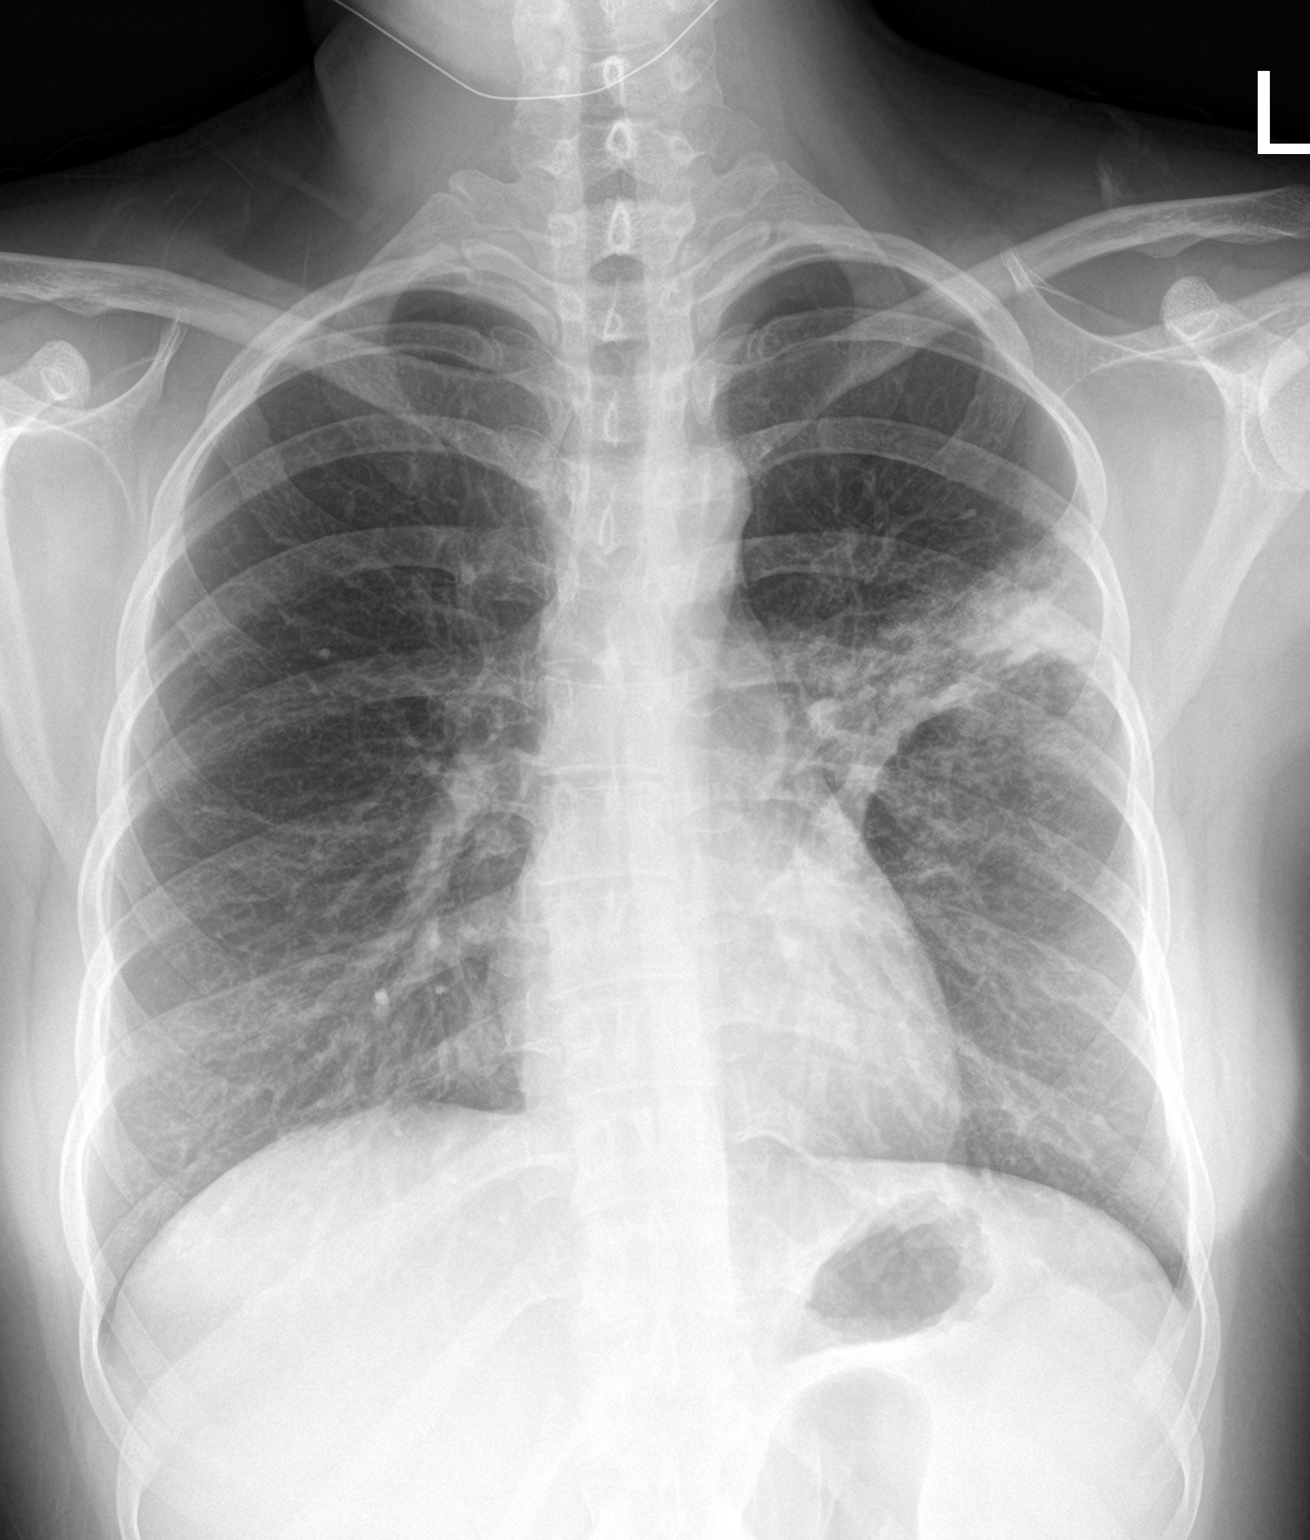

[chest lat]
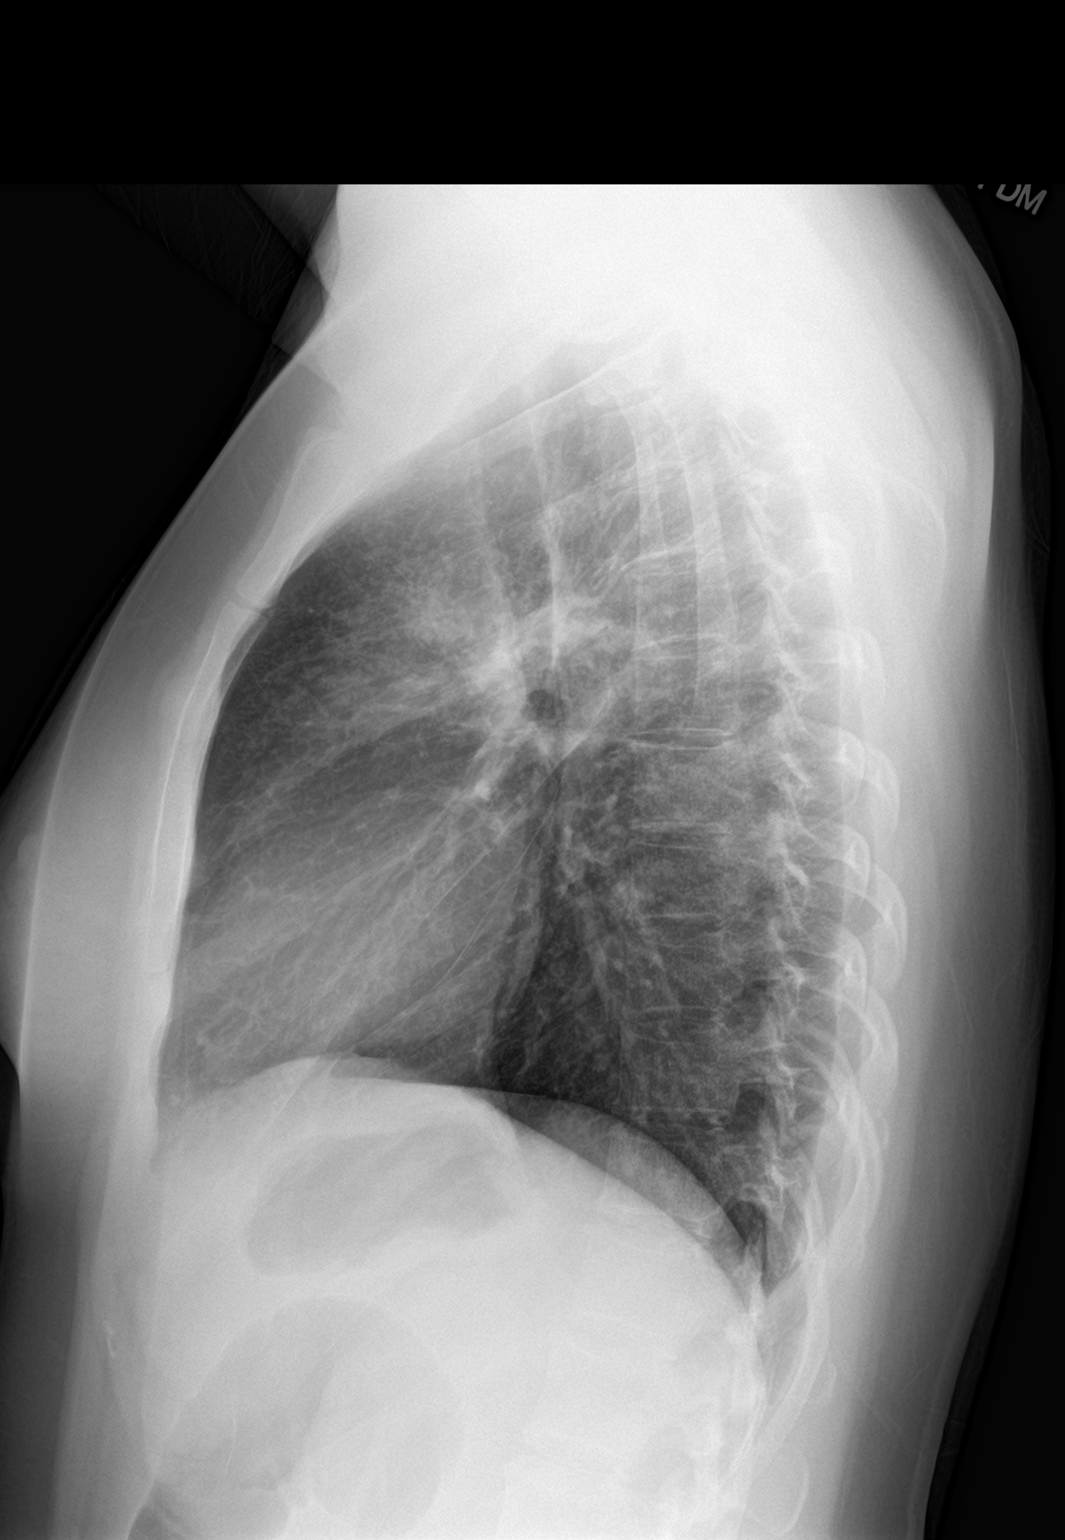

[2 of 2 positions shown; findings below may reference images not displayed]

FINDINGS: Left upper lobe infiltrate consistent with pneumonia. Right lung is
clear. No pleural effusion. Heart size and vascularity normal.
IMPRESSION: Left upper lobe pneumonia

## 2016-10-20 ENCOUNTER — Other Ambulatory Visit: Payer: Self-pay | Admitting: Internal Medicine

## 2016-10-20 NOTE — Telephone Encounter (Signed)
Denied.  Filled on 08/18/16 for 6 months.  Message sent to the pharmacy to check file.

## 2016-12-09 DIAGNOSIS — Z0271 Encounter for disability determination: Secondary | ICD-10-CM

## 2016-12-28 ENCOUNTER — Encounter: Payer: Self-pay | Admitting: Internal Medicine

## 2016-12-28 ENCOUNTER — Ambulatory Visit (INDEPENDENT_AMBULATORY_CARE_PROVIDER_SITE_OTHER): Payer: BC Managed Care – PPO | Admitting: Internal Medicine

## 2016-12-28 VITALS — BP 110/70 | HR 78 | Temp 98.6°F | Ht 62.0 in | Wt 142.2 lb

## 2016-12-28 DIAGNOSIS — E063 Autoimmune thyroiditis: Secondary | ICD-10-CM | POA: Diagnosis not present

## 2016-12-28 DIAGNOSIS — R7303 Prediabetes: Secondary | ICD-10-CM

## 2016-12-28 DIAGNOSIS — Q998 Other specified chromosome abnormalities: Secondary | ICD-10-CM | POA: Diagnosis not present

## 2016-12-28 DIAGNOSIS — E8881 Metabolic syndrome: Secondary | ICD-10-CM | POA: Diagnosis not present

## 2016-12-28 DIAGNOSIS — E282 Polycystic ovarian syndrome: Secondary | ICD-10-CM | POA: Diagnosis not present

## 2016-12-28 DIAGNOSIS — R6250 Unspecified lack of expected normal physiological development in childhood: Secondary | ICD-10-CM | POA: Insufficient documentation

## 2016-12-28 DIAGNOSIS — R632 Polyphagia: Secondary | ICD-10-CM | POA: Diagnosis not present

## 2016-12-28 DIAGNOSIS — E611 Iron deficiency: Secondary | ICD-10-CM

## 2016-12-28 NOTE — Patient Instructions (Addendum)
Fasting lab   Tests  ( I will put in order )   Will do form and contact you to pick up.   Avoid simple carbohydrates  That make her hungry .   And will check  insulin levels .   Then plan follow up.   Wt Readings from Last 3 Encounters:  12/28/16 142 lb 3.2 oz (64.5 kg)  06/15/16 142 lb 6.4 oz (64.6 kg)  03/01/16 142 lb 1 oz (64.4 kg)

## 2016-12-28 NOTE — Progress Notes (Signed)
Chief Complaint  Patient presents with  . Follow-up    mom states that pt iron is low     HPI: Elizabeth Cantrell 26 y.o. come in for Chronic disease management  Mom concern about eating too much  She is also turning [age] which ages her out of her parent's insurance except for disability. She's been improved force Social Security disability last year. Mom states that she seems to want to eat and is hungry all the time although hasn't gained weight. No low blood sugars but no high blood sugars she is checking it occasionally in its about 80. She is taking 7:15 metformin a day. She does have a scrape on her head she fell out of her king-sized bed and the other day but no other injury. She saw a dermatologist about her thinning hair and she has iron deficiency. She has continued regular. Sometimes heavy. I think she is taking iron.  ROS: See pertinent positives and negatives per HPI. No cp sob syncope  Lives with mom  No snoring osa   Past Medical History:  Diagnosis Date  . Acne   . Acquired acanthosis nigricans   . Amenorrhea    until beginning glucophage  . ANA positive   . 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 Testing for Rett Syndrome normal.  . Fractured elbow    x 2 with falls  . Heavy periods   . Hirsutism    with elevated free testosterone levels  . Hyperinsulinism   . Hypoglycemia    with seizures that seem to respond to glucose  . Nosebleed    with abnormal clotting studies  . Seizure disorder (HCC)   . Seizures (HCC)    hx of  . Striae   . Tachycardia   . 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 Diabetes Mother   . Hypertension Mother   . Hyperlipidemia      parent grandparent    Social History   Social History  . Marital status: Married    Spouse name: N/A  . Number of children: N/A  . Years of  education: 6112   Social History Main Topics  . Smoking status: Never Smoker  . Smokeless tobacco: Never Used  . Alcohol use No  . Drug use: No  . Sexual activity: Not Asked   Other Topics Concern  . 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   Caffeine Use: none   No pets     Outpatient Medications Prior to Visit  Medication Sig Dispense Refill  . metFORMIN (GLUCOPHAGE-XR) 750 MG 24 hr tablet TAKE 1 TABLET BY MOUTH DAILY WITH BREAKFAST 30 tablet 5  . UNABLE TO FIND Med Name: Focus Factor, 2 tablets/day     No facility-administered medications prior to visit.      EXAM:  BP 110/70 (BP Location: Right Arm, Patient Position: Sitting, Cuff Size: Normal)   Pulse 78   Temp 98.6 F (37 C) (Oral)   Ht 5\' 2"  (1.575 m)   Wt 142 lb 3.2 oz (64.5 kg)   BMI 26.01  kg/m   Body mass index is 26.01 kg/m.  GENERAL: vitals reviewed and listed above, alert, oriented, appears well hydrated and in no acute distress has healing superficial abrasion on forehead. Follows direction and is pleasant. HEENT: , conjunctiva  clear, no obvious abnormalities on inspection of external nose and ears OP : no lesion edema or exudate hair thinning  NECK: no obvious masses on inspection palpation  LUNGS: clear to auscultation bilaterally, no wheezes, rales or rhonchi, good air movement CV: HRRR, no clubbing cyanosis or  peripheral edema nl cap refill  MS: moves all extremities without noticeable focal  Abnormality Thinning hair on scalp no rash  No inc hirsutism.  PSYCH: pleasant and cooperative, limited  But  Conversant  Lab Results  Component Value Date   WBC 8.7 03/01/2016   HGB 14.3 03/01/2016   HCT 40.4 03/01/2016   PLT 241.0 03/01/2016   GLUCOSE 92 03/01/2016   CHOL 126 03/01/2016   TRIG 98.0 03/01/2016   HDL 48.30 03/01/2016    LDLDIRECT 66.8 05/30/2013   LDLCALC 58 03/01/2016   ALT 14 03/01/2016   AST 12 03/01/2016   NA 137 03/01/2016   K 4.0 03/01/2016   CL 102 03/01/2016   CREATININE 0.54 03/01/2016   BUN 9 03/01/2016   CO2 28 03/01/2016   TSH 1.39 03/01/2016   HGBA1C 4.1 (L) 03/01/2016   BP Readings from Last 3 Encounters:  12/28/16 110/70  06/15/16 118/71  03/01/16 102/70   Wt Readings from Last 3 Encounters:  12/28/16 142 lb 3.2 oz (64.5 kg)  06/15/16 142 lb 6.4 oz (64.6 kg)  03/01/16 142 lb 1 oz (64.4 kg)    ASSESSMENT AND PLAN:  Discussed the following assessment and plan:  PCOS (polycystic ovarian syndrome) - Plan: Basic metabolic panel, CBC with Differential/Platelet, Hemoglobin A1c, Hepatic function panel, Lipid panel, TSH, T4, free, Insulin, fasting, Iron, TIBC and Ferritin Panel, Fructosamine  Prediabetes - Plan: Basic metabolic panel, CBC with Differential/Platelet, Hemoglobin A1c, Hepatic function panel, Lipid panel, TSH, T4, free, Insulin, fasting, Iron, TIBC and Ferritin Panel, Fructosamine  Insulin resistance - Plan: Basic metabolic panel, CBC with Differential/Platelet, Hemoglobin A1c, Hepatic function panel, Lipid panel, TSH, T4, free, Insulin, fasting, Iron, TIBC and Ferritin Panel, Fructosamine  Developmental delay disorder - will copmplete form for insuance of dx and medical condition.  - Plan: Basic metabolic panel, CBC with Differential/Platelet, Hemoglobin A1c, Hepatic function panel, Lipid panel, TSH, T4, free, Insulin, fasting, Iron, TIBC and Ferritin Panel, Fructosamine  Thyroiditis, autoimmune ? 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 if low bg but not on a lot of metformin  and had documented insulin resistance in past  labs and fu at her cpx. - Plan: Basic metabolic panel, CBC with Differential/Platelet, Hemoglobin A1c, Hepatic function panel, Lipid panel, TSH, T4, free, Insulin, fasting, Iron, TIBC and Ferritin Panel, Fructosamine Form for mom  To complete    -Patient advised to return or notify health care team  if  new concerns arise.  Patient Instructions   Fasting lab   Tests  ( I will put in order )   Will do  form and contact you to pick up.   Avoid simple carbohydrates  That make her hungry .   And will check  insulin levels .   Then plan follow up.   Wt Readings from Last 3 Encounters:  12/28/16 142 lb 3.2 oz (64.5 kg)  06/15/16 142 lb 6.4 oz (64.6 kg)  03/01/16 142 lb 1 oz (64.4 kg)       Lorrie Gargan K. Tamyra Fojtik M.D.

## 2017-01-05 ENCOUNTER — Other Ambulatory Visit (INDEPENDENT_AMBULATORY_CARE_PROVIDER_SITE_OTHER): Payer: BC Managed Care – PPO

## 2017-01-05 ENCOUNTER — Other Ambulatory Visit: Payer: BC Managed Care – PPO

## 2017-01-05 DIAGNOSIS — E611 Iron deficiency: Secondary | ICD-10-CM | POA: Diagnosis not present

## 2017-01-05 DIAGNOSIS — E063 Autoimmune thyroiditis: Secondary | ICD-10-CM | POA: Diagnosis not present

## 2017-01-05 DIAGNOSIS — R632 Polyphagia: Secondary | ICD-10-CM | POA: Diagnosis not present

## 2017-01-05 DIAGNOSIS — E8881 Metabolic syndrome: Secondary | ICD-10-CM | POA: Diagnosis not present

## 2017-01-05 DIAGNOSIS — R6250 Unspecified lack of expected normal physiological development in childhood: Secondary | ICD-10-CM

## 2017-01-05 DIAGNOSIS — R7303 Prediabetes: Secondary | ICD-10-CM | POA: Diagnosis not present

## 2017-01-05 DIAGNOSIS — E282 Polycystic ovarian syndrome: Secondary | ICD-10-CM | POA: Diagnosis not present

## 2017-01-05 DIAGNOSIS — R7309 Other abnormal glucose: Secondary | ICD-10-CM

## 2017-01-05 LAB — CBC WITH DIFFERENTIAL/PLATELET
Basophils Absolute: 0 cells/uL (ref 0–200)
Basophils Relative: 0 %
Eosinophils Absolute: 200 cells/uL (ref 15–500)
Eosinophils Relative: 2 %
HEMATOCRIT: 40.7 % (ref 35.0–45.0)
HEMOGLOBIN: 14 g/dL (ref 11.7–15.5)
Lymphocytes Relative: 34 %
Lymphs Abs: 3400 cells/uL (ref 850–3900)
MCH: 30.6 pg (ref 27.0–33.0)
MCHC: 34.4 g/dL (ref 32.0–36.0)
MCV: 89.1 fL (ref 80.0–100.0)
MONO ABS: 500 {cells}/uL (ref 200–950)
MPV: 11.7 fL (ref 7.5–12.5)
Monocytes Relative: 5 %
NEUTROS PCT: 59 %
Neutro Abs: 5900 cells/uL (ref 1500–7800)
Platelets: 281 10*3/uL (ref 140–400)
RBC: 4.57 MIL/uL (ref 3.80–5.10)
RDW: 14.1 % (ref 11.0–15.0)
WBC: 10 10*3/uL (ref 3.8–10.8)

## 2017-01-05 LAB — LIPID PANEL
CHOLESTEROL: 134 mg/dL (ref 0–200)
HDL: 44.3 mg/dL (ref >39.00)
LDL Cholesterol: 67 mg/dL (ref 0–99)
NONHDL: 90.1
TRIGLYCERIDES: 116 mg/dL (ref 0.0–149.0)
Total CHOL/HDL Ratio: 3
VLDL: 23.2 mg/dL (ref 0.0–40.0)

## 2017-01-05 LAB — HEPATIC FUNCTION PANEL
ALBUMIN: 4.1 g/dL (ref 3.5–5.2)
ALK PHOS: 64 U/L (ref 39–117)
ALT: 11 U/L (ref 0–35)
AST: 13 U/L (ref 0–37)
Bilirubin, Direct: 0.1 mg/dL (ref 0.0–0.3)
TOTAL PROTEIN: 7.5 g/dL (ref 6.0–8.3)
Total Bilirubin: 0.7 mg/dL (ref 0.2–1.2)

## 2017-01-05 LAB — BASIC METABOLIC PANEL
BUN: 9 mg/dL (ref 6–23)
CALCIUM: 9.4 mg/dL (ref 8.4–10.5)
CO2: 22 mEq/L (ref 19–32)
CREATININE: 0.51 mg/dL (ref 0.40–1.20)
Chloride: 105 mEq/L (ref 96–112)
GFR: 155.04 mL/min (ref >60.00)
Glucose, Bld: 91 mg/dL (ref 70–99)
Potassium: 4.1 mEq/L (ref 3.5–5.1)
Sodium: 137 mEq/L (ref 135–145)

## 2017-01-05 LAB — HEMOGLOBIN A1C: Hgb A1c MFr Bld: 4.1 % — ABNORMAL LOW (ref 4.6–6.5)

## 2017-01-05 LAB — T4, FREE: Free T4: 1.04 ng/dL (ref 0.60–1.60)

## 2017-01-05 LAB — TSH: TSH: 3.61 u[IU]/mL (ref 0.35–4.50)

## 2017-01-06 LAB — IRON,TIBC AND FERRITIN PANEL
%SAT: 32 % (ref 11–50)
Ferritin: 48 ng/mL (ref 10–154)
IRON: 76 ug/dL (ref 40–190)
TIBC: 239 ug/dL — ABNORMAL LOW (ref 250–450)

## 2017-01-06 LAB — INSULIN, FASTING: Insulin fasting, serum: 20.3 u[IU]/mL — ABNORMAL HIGH (ref 2.0–19.6)

## 2017-01-07 LAB — FRUCTOSAMINE: Fructosamine: 238 umol/L (ref 190–270)

## 2017-01-31 MED ORDER — METFORMIN HCL ER 750 MG PO TB24: 750.0000 mg | ORAL_TABLET | Freq: Two times a day (BID) | ORAL | 5 refills | Status: DC

## 2017-03-11 ENCOUNTER — Encounter: Payer: BC Managed Care – PPO | Admitting: Internal Medicine

## 2017-04-03 NOTE — Progress Notes (Signed)
Chief Complaint  Patient presents with  . Annual Exam    HPI: Patient  Elizabeth Cantrell  26 y.o. 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  Last 7 days feels not need pap   No exposures  Still thinning hair on top no treatment was told poss autoimmunie.    Health Maintenance  Topic Date Due  . PAP SMEAR  02/07/2012  . HIV Screening  04/05/2018 (Originally 02/06/2006)  . INFLUENZA VACCINE  05/25/2017  . TETANUS/TDAP  05/31/2023   Health Maintenance Review LIFESTYLE:  Exercise:  Tried walking not to do  fatigue Tobacco/ETS: no Alcohol:   no Sugar beverages: smoothie  Sleep: interrupted  11 -  Drug use: no HH of  [employer] and walking   Exercise  Park .    Pt  Also     ROS:  GEN/ HEENT: No fever, significant weight changes sweats headaches vision problems hearing changes, CV/ PULM; No chest pain shortness of breath cough, syncope,edema  change in exercise tolerance. GI /GU: No adominal pain, vomiting, change in bowel habits. No blood in the stool. No significant GU symptoms. SKIN/HEME: ,no acute skin rashes suspicious lesions or bleeding. No lymphadenopathy, nodules, masses.  NEURO/ PSYCH:  No neurologic signs such as weakness numbness. No depression anxiety. IMM/ Allergy: No unusual infections.  Allergy .   REST of 12 system review negative except as per HPI   Past Medical History:  Diagnosis Date  . Acne   . Acquired acanthosis nigricans   . Amenorrhea    until beginning glucophage  . ANA positive   .  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 Testing for Rett Syndrome normal.  . Fractured elbow    x 2 with falls  . Heavy periods   . Hirsutism    with elevated free testosterone levels  . Hyperinsulinism   . Hypoglycemia    with seizures that seem to respond to glucose  . Nosebleed    with abnormal clotting studies  . Seizure disorder (HCC)   . Seizures (HCC)    hx of  . Striae   . Tachycardia   . 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 No pertinent surgical history.  Family History  Problem Relation Age of Onset  . Diabetes Mother   . Hypertension Mother   . Hyperlipidemia Unknown        parent grandparent    Social History   Social History  . Marital status: Married    Spouse name: N/A  . Number of children: N/A  . Years of education: 32   Social History Main Topics  . Smoking status: Never Smoker  . Smokeless tobacco: Never Used  . Alcohol use No  . Drug use: No  . Sexual activity: Not Asked   Other Topics Concern  . None  Social History Narrative   hh of  3   [employer]  [employer] gets    Neg tad   6 hours sleep gets speech therapy at school ( no ot pt)   Patient lives at home with her parents.   Caffeine Use: none   No pets     Outpatient Medications Prior to Visit  Medication Sig Dispense Refill  . metFORMIN (GLUCOPHAGE XR) 750 MG 24 hr tablet Take 1 tablet (750 mg total) by mouth 2 (two) times daily. (Patient taking differently: Take 750 mg by mouth 2 (two) times daily. Patient is taking one daily/ twice a day caused vomiting) 60 tablet 5  . UNABLE TO FIND Med Name: Focus Factor, 2 tablets/day    . metFORMIN (GLUCOPHAGE-XR) 750 MG 24 hr tablet TAKE 1 TABLET BY MOUTH DAILY WITH BREAKFAST 30 tablet 5   No facility-administered medications prior to visit.      EXAM:  BP 106/70 (BP Location: Right Arm,  Patient Position: Sitting, Cuff Size: Normal)   Pulse 83   Temp 97.4 F (36.3 C) (Oral)   Ht 5\' 6"  (1.676 m)   Wt 142 lb 9.6 oz (64.7 kg)   BMI 23.02 kg/m   Body mass index is 23.02 kg/m. Wt Readings from Last 3 Encounters:  04/05/17 142 lb 9.6 oz (64.7 kg)  12/28/16 142 lb 3.2 oz (64.5 kg)  06/15/16 142 lb 6.4 oz (64.6 kg)    Physical Exam: Vital signs reviewed [id number]:This is a well-developed well-nourished alert cooperative    who appearsr stated age in no acute distress.  HEENT: microcephalic atraumatic , Eyes: PERRL EOM's full, conjunctiva clear, Nares: paten,t no deformity discharge or tenderness., Ears: no deformity EAC's clear TMs with normal landmarks. Mouth: clear OP, no lesions, edema.  Moist mucous membranes. Dentition in adequate repair. NECK: supple without masses, thyromegaly or bruits. CHEST/PULM:  Clear to auscultation and percussion breath sounds equal no wheeze , rales or rhonchi. No chest wall deformities or tenderness. Breast: normal by inspection . No dimpling, discharge, masses, tenderness or discharge . CV: PMI is nondisplaced, S1 S2 no gallops, murmurs, rubs. Peripheral pulses are full without delay.No JVD .  ABDOMEN: Bowel sounds normal nontender  No guard or rebound, no hepato splenomegal no CVA tenderness.  No hernia.   ? stoll llq  Extremtities:  No clubbing cyanosis or edema, no acute joint swelling or redness   falt feet  Distal dec musckle  NEURO:  Oriented x3, cranial nerves 3-12 appear to be intact, no obvious focal weakness,gait within normal limits no abnormal reflexes or asymmetrical SKIN: No acute rashes normal turgor, color, no bruising or petechiae. PSYCH: Oriented, good eye contact, no obvious depression anxiety,  LN: no cervical axillary inguinal adenopathy  Lab Results  Component Value Date   WBC 10.0 01/05/2017   HGB 14.0 01/05/2017   HCT 40.7 01/05/2017   PLT 281 01/05/2017   GLUCOSE 91 01/05/2017   CHOL 134 01/05/2017   TRIG 116.0  01/05/2017   HDL 44.30 01/05/2017   LDLDIRECT 66.8 05/30/2013   LDLCALC 67 01/05/2017   ALT 11 01/05/2017   AST 13 01/05/2017   NA 137 01/05/2017   K 4.1 01/05/2017   CL 105 01/05/2017   CREATININE 0.51 01/05/2017   BUN 9 01/05/2017   CO2 22 01/05/2017   TSH 3.61 01/05/2017   HGBA1C 4.1 (L) 01/05/2017    BP Readings from Last 3 Encounters:  04/05/17 106/70  12/28/16 110/70  06/15/16 118/71  Wt Readings from Last 3 Encounters:  04/05/17 142 lb 9.6 oz (64.7 kg)  12/28/16 142 lb 3.2 oz (64.5 kg)  06/15/16 142 lb 6.4 oz (64.6 kg)    Lab results reviewed with patient   ASSESSMENT AND PLAN:  Discussed the following assessment and plan:  Visit for preventive health examination  Insulin resistance  PCOS (polycystic ovarian syndrome)  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  Vomiting, intractability of vomiting not specified, presence of nausea not specified, unspecified vomiting type - intermititne poss form med other  check  [place] and h pylori - Plan: US Abdomen Complete, H. pylori antigen, stool, CANCELED: Helicobacter pylori antigen det, stool, CANCELED: Helicobacter pylori antigen det, stool  Dyspepsia - Plan: US Abdomen Complete, H. pylori antigen, stool, CANCELED: Helicobacter pylori antigen det, stool, CANCELED: Helicobacter pylori antigen det, stool Hard to define  Cause  options discussed  Get [place] check gb etc abd and  Then stool for h pylori  considier seeing  Gi stay at lower dose of metformin  Check celiac screen etc  Patient Care Team: Madelin Headings, MD as PCP - General (Internal Medicine) Talmage Coin, MD as Attending Physician (Endocrinology) Patient Instructions   Okay to stay on the lower dose of metformin as tolerated. You'll be contacted about getting an abdominal ultrasound which is the sound wave test in the abdomen can look at the liver the gallbladder among other things and  his noninvasive. Would also collect a stool test to check for an infection called H. pylori that can cause gastritis or ulcers. If that is positive there is a treatment. If continuing GI problem or concern we can add 150 mg twice a day and consider seeing the GI department. There are other x-rays or imaging that can be done if needed. Such as an Upper gi test  Swallowing  X ray  Or ct scan of the abdomen.   But since she is better in between  Can wait for now.    ocps or homone pills   May suppress the testosterone  Of PCOS   .   Also  spironolactone has been used also .       Neta Mends. Kimaya Whitlatch M.D.

## 2017-04-05 ENCOUNTER — Ambulatory Visit (INDEPENDENT_AMBULATORY_CARE_PROVIDER_SITE_OTHER): Payer: BC Managed Care – PPO | Admitting: Internal Medicine

## 2017-04-05 ENCOUNTER — Encounter: Payer: Self-pay | Admitting: Internal Medicine

## 2017-04-05 VITALS — BP 106/70 | HR 83 | Temp 97.4°F | Ht 66.0 in | Wt 142.6 lb

## 2017-04-05 DIAGNOSIS — E063 Autoimmune thyroiditis: Secondary | ICD-10-CM | POA: Diagnosis not present

## 2017-04-05 DIAGNOSIS — R1110 Vomiting, unspecified: Secondary | ICD-10-CM | POA: Diagnosis not present

## 2017-04-05 DIAGNOSIS — Q998 Other specified chromosome abnormalities: Secondary | ICD-10-CM

## 2017-04-05 DIAGNOSIS — Z0000 Encounter for general adult medical examination without abnormal findings: Secondary | ICD-10-CM | POA: Diagnosis not present

## 2017-04-05 DIAGNOSIS — Z79899 Other long term (current) drug therapy: Secondary | ICD-10-CM | POA: Diagnosis not present

## 2017-04-05 DIAGNOSIS — E8881 Metabolic syndrome: Secondary | ICD-10-CM | POA: Diagnosis not present

## 2017-04-05 DIAGNOSIS — E282 Polycystic ovarian syndrome: Secondary | ICD-10-CM | POA: Diagnosis not present

## 2017-04-05 DIAGNOSIS — R1013 Epigastric pain: Secondary | ICD-10-CM

## 2017-04-05 DIAGNOSIS — Q922 Partial trisomy: Secondary | ICD-10-CM

## 2017-04-05 DIAGNOSIS — E88819 Insulin resistance, unspecified: Secondary | ICD-10-CM

## 2017-04-05 NOTE — Patient Instructions (Signed)
  Okay to stay on the lower dose of metformin as tolerated. You'll be contacted about getting an abdominal ultrasound which is the sound wave test in the abdomen can look at the liver the gallbladder among other things and his noninvasive. Would also collect a stool test to check for an infection called H. pylori that can cause gastritis or ulcers. If that is positive there is a treatment. If continuing GI problem or concern we can add 150 mg twice a day and consider seeing the GI department. There are other x-rays or imaging that can be done if needed. Such as an Upper gi test  Swallowing  X ray  Or ct scan of the abdomen.   But since she is better in between  Can wait for now.    ocps or homone pills   May suppress the testosterone  Of PCOS   .   Also  spironolactone has been used also .

## 2017-05-02 ENCOUNTER — Ambulatory Visit
Admission: RE | Admit: 2017-05-02 | Discharge: 2017-05-02 | Disposition: A | Discharge status: Home / Self Care | Payer: BC Managed Care – PPO | Source: Ambulatory Visit | Attending: Internal Medicine | Admitting: Internal Medicine

## 2017-05-02 DIAGNOSIS — R1013 Epigastric pain: Secondary | ICD-10-CM

## 2017-05-02 DIAGNOSIS — R1110 Vomiting, unspecified: Secondary | ICD-10-CM

## 2017-07-15 ENCOUNTER — Encounter: Payer: Self-pay | Admitting: Internal Medicine

## 2017-11-08 ENCOUNTER — Other Ambulatory Visit: Payer: Self-pay | Admitting: Internal Medicine

## 2018-01-05 ENCOUNTER — Encounter: Payer: Self-pay | Admitting: Family Medicine

## 2018-01-05 ENCOUNTER — Ambulatory Visit: Payer: BC Managed Care – PPO | Admitting: Family Medicine

## 2018-01-05 VITALS — BP 122/78 | HR 114 | Temp 97.9°F | Wt 140.7 lb

## 2018-01-05 DIAGNOSIS — J029 Acute pharyngitis, unspecified: Secondary | ICD-10-CM | POA: Diagnosis not present

## 2018-01-05 DIAGNOSIS — J020 Streptococcal pharyngitis: Secondary | ICD-10-CM

## 2018-01-05 LAB — POCT RAPID STREP A (OFFICE): Rapid Strep A Screen: POSITIVE — AB

## 2018-01-05 MED ORDER — AMOXICILLIN-POT CLAVULANATE 875-125 MG PO TABS: 1.0000 | ORAL_TABLET | Freq: Two times a day (BID) | ORAL | 0 refills | Status: AC

## 2018-01-05 MED ORDER — METHYLPREDNISOLONE ACETATE 40 MG/ML IJ SUSP: 40.0000 mg | Freq: Once | INTRAMUSCULAR | Status: DC

## 2018-01-05 NOTE — Progress Notes (Signed)
Subjective:    Patient ID: Elizabeth Cantrell, female    DOB: 16-Oct-1991, 27 y.o.   MRN: [id number]  Chief Complaint  Patient presents with  . Sore Throat    swollen tonsils, x1 month, hard to talk, throat does not hurt, fever, used amoxicillin helped syptoms started again yesterday, tired, night sweats,     HPI Elizabeth Cantrell was seen today for ongoing concern.  Per mom Elizabeth Cantrell with tonsillitis since February 20.  Elizabeth Cantrell initially tried TheraFlu for her symptoms.  Mom felt like she needed an antibiotic so she gave her some of her leftover amoxicillin that she had from a dental procedure.  Elizabeth Cantrell took amoxicillin for 4 days.  Per mom patient symptoms improved but became worse.  Mom endorses noting Elizabeth Cantrell having difficulty speaking, her neck appearing full, and extremely enlarged tonsils.  She also endorses low-grade fever, thinks the temperature was 38 point something Celsius.  Per mom the family recently ended their vacation a day early because of patient's symptoms.  Upon returning home patient's mother found 1 more pill of amoxicillin and gave to the patient.  Past Medical History:  Diagnosis Date  . Acne   . Acquired acanthosis nigricans   . Amenorrhea    until beginning glucophage  . ANA positive   . 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 Testing for Rett Syndrome normal.  . Fractured elbow    x 2 with falls  . Heavy periods   . Hirsutism    with elevated free testosterone levels  . Hyperinsulinism   . Hypoglycemia    with seizures that seem to respond to glucose  . Nosebleed    with abnormal clotting studies  . Seizure disorder (HCC)   . Seizures (HCC)    hx of  . Striae   . Tachycardia   . 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    No Known Allergies  ROS General: Denies chills, night sweats, changes in weight, changes in appetite   +fever HEENT: Denies headaches, ear pain, changes in vision,  rhinorrhea      +sore throat CV: Denies CP, palpitations, SOB, orthopnea Pulm: Denies SOB, cough, wheezing GI: Denies abdominal pain, nausea, vomiting, diarrhea, constipation GU: Denies dysuria, hematuria, frequency, vaginal discharge Msk: Denies muscle cramps, joint pains Neuro: Denies weakness, numbness, tingling Skin: Denies rashes, bruising Psych: Denies depression, anxiety, hallucinations     Objective:    Blood pressure 122/78, pulse (!) 114, temperature 97.9 F (36.6 C), temperature source Oral, weight 140 lb 11.2 oz (63.8 kg), SpO2 98 %.   Gen. Pleasant, well-nourished, in no distress, normal affect   HEENT: [place]/AT, face symmetric, conjunctiva clear, no scleral icterus, PERRLA, EOMI, nares patent without drainage, increased saliva mouth, but no drooling, pharynx with erythema.  Extremely enlarged tonsils with exudate, nearly kissing.  TMs full bilaterally. Lungs: no accessory muscle use, CTAB, no wheezes or rales. Cardiovascular: RRR, no m/r/g, no peripheral edema Abdomen: BS present, soft, NT/ND Neuro:  A&Ox3, CN II-XII intact, normal gait   Wt Readings from Last 3 Encounters:  01/05/18 140 lb 11.2 oz (63.8 kg)  04/05/17 142 lb 9.6 oz (64.7 kg)  12/28/16 142 lb 3.2 oz (64.5 kg)    Lab Results  Component Value Date   WBC 10.0 01/05/2017   HGB 14.0 01/05/2017   HCT 40.7 01/05/2017   PLT 281 01/05/2017   GLUCOSE 91 01/05/2017  CHOL 134 01/05/2017   TRIG 116.0 01/05/2017   HDL 44.30 01/05/2017   LDLDIRECT 66.8 05/30/2013   LDLCALC 67 01/05/2017   ALT 11 01/05/2017   AST 13 01/05/2017   NA 137 01/05/2017   K 4.1 01/05/2017   CL 105 01/05/2017   CREATININE 0.51 01/05/2017   BUN 9 01/05/2017   CO2 22 01/05/2017   TSH 3.61 01/05/2017   HGBA1C 4.1 (L) 01/05/2017    Assessment/Plan:  Strep pharyngitis  -Elizabeth Cantrell and mother advised to take medications as prescribed and only if prescribed to them. -Given severity of pharyngeal/tonsilar edema advised on steroid use.   Elizabeth Cantrell's mother initially refused stating Elizabeth Cantrell is diabetic.  Discussed the r/b/a.  Mother and Elizabeth Cantrell in agreement to Depo-medrol 40 mg IM.  Advised fsbs likely to increase some.  Continue Metformin XR 750 mg daily.  (mother initially stated she would increase the med to 2 pills--advised to take med as directed) -given handout. - Plan: amoxicillin-clavulanate (AUGMENTIN) 875-125 MG tablet, methylPREDNISolone acetate (DEPO-MEDROL) injection 40 mg -given RTC or ED precautions.  Sore throat  - Plan: POCT rapid strep A  F/u prn  Abbe Amsterdam, MD

## 2018-01-05 NOTE — Patient Instructions (Addendum)
Strep Throat Strep throat is a bacterial infection of the throat. Your health care provider may call the infection tonsillitis or pharyngitis, depending on whether there is swelling in the tonsils or at the back of the throat. Strep throat is most common during the cold months of the year in children who are 5-27 years of age, but it can happen during any season in people of any age. This infection is spread from person to person (contagious) through coughing, sneezing, or close contact. What are the causes? Strep throat is caused by the bacteria called Streptococcus pyogenes. What increases the risk? This condition is more likely to develop in:  People who spend time in crowded places where the infection can spread easily.  People who have close contact with someone who has strep throat.  What are the signs or symptoms? Symptoms of this condition include:  Fever or chills.  Redness, swelling, or pain in the tonsils or throat.  Pain or difficulty when swallowing.  White or yellow spots on the tonsils or throat.  Swollen, tender glands in the neck or under the jaw.  Red rash all over the body (rare).  How is this diagnosed? This condition is diagnosed by performing a rapid strep test or by taking a swab of your throat (throat culture test). Results from a rapid strep test are usually ready in a few minutes, but throat culture test results are available after one or two days. How is this treated? This condition is treated with antibiotic medicine. Follow these instructions at home: Medicines  Take over-the-counter and prescription medicines only as told by your health care provider.  Take your antibiotic as told by your health care provider. Do not stop taking the antibiotic even if you start to feel better.  Have family members who also have a sore throat or fever tested for strep throat. They may need antibiotics if they have the strep infection. Eating and drinking  Do not  share food, drinking cups, or personal items that could cause the infection to spread to other people.  If swallowing is difficult, try eating soft foods until your sore throat feels better.  Drink enough fluid to keep your urine clear or pale yellow. General instructions  Gargle with a salt-water mixture 3-4 times per day or as needed. To make a salt-water mixture, completely dissolve -1 tsp of salt in 1 cup of warm water.  Make sure that all household members wash their hands well.  Get plenty of rest.  Stay home from school or work until you have been taking antibiotics for 24 hours.  Keep all follow-up visits as told by your health care provider. This is important. Contact a health care provider if:  The glands in your neck continue to get bigger.  You develop a rash, cough, or earache.  You cough up a thick liquid that is green, yellow-brown, or bloody.  You have pain or discomfort that does not get better with medicine.  Your problems seem to be getting worse rather than better.  You have a fever. Get help right away if:  You have new symptoms, such as vomiting, severe headache, stiff or painful neck, chest pain, or shortness of breath.  You have severe throat pain, drooling, or changes in your voice.  You have swelling of the neck, or the skin on the neck becomes red and tender.  You have signs of dehydration, such as fatigue, dry mouth, and decreased urination.  You become increasingly sleepy, or   you cannot wake up completely.  Your joints become red or painful. This information is not intended to replace advice given to you by your health care provider. Make sure you discuss any questions you have with your health care provider. Document Released: 10/08/2000 Document Revised: 06/09/2016 Document Reviewed: 02/03/2015 Elsevier Interactive Patient Education  2018 Elsevier Inc.  

## 2018-01-17 ENCOUNTER — Other Ambulatory Visit: Payer: Self-pay | Admitting: Internal Medicine

## 2018-03-21 ENCOUNTER — Other Ambulatory Visit: Payer: Self-pay | Admitting: Internal Medicine

## 2018-04-13 NOTE — Progress Notes (Signed)
Chief Complaint  Patient presents with  . Annual Exam    HPI: Patient  Elizabeth Cantrell  27 y.o. comes in today for Preventive Health Care visit  With mom  takine 750 xr bid metformin  Tried exerice traning for a while will take walks  Hard to get t in social services but joined  [employer] Date Due  . HIV Screening  02/06/2006  . PAP SMEAR  02/07/2012  . INFLUENZA VACCINE  05/25/2018  . TETANUS/TDAP  05/31/2023   Health Maintenance Review LIFESTYLE:  Exercise:   At nighttime.   30 minutes  And using trainer.  Tobacco/ETS:n Alcohol:  n Sugar beverages:  oj smoothie  Soda  And [employer] coffee  Pre seetened tea.  Sleep: 3 am  Restless.  Drug use: no peridos monthly .   ROS:  See above  GEN/ HEENT: No fever, significant weight changes sweats headaches vision problems hearing changes, CV/ PULM; No chest pain shortness of breath cough, syncope,edema  change in exercise tolerance. GI /GU: No adominal pain, vomiting, change in bowel habits. No blood in the stool. No significant GU symptoms. SKIN/HEME: ,no acute skin rashes some inc  When sugar up axilla  Nodule and acne like sx  Legs  suspicious lesions or bleeding. No lymphadenopathy, nodules, masses.  NEURO/ PSYCH:  No neurologic signs such as weakness numbness. No depression anxiety. IMM/ Allergy: No unusual infections.  Allergy .   REST of 12 system review negative except as per HPI   Past Medical History:  Diagnosis Date  . Acne   . Acquired acanthosis nigricans   . Amenorrhea    until beginning glucophage  . ANA positive   . 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 Testing for Rett Syndrome normal.  . Fractured elbow    x 2 with falls  . Heavy periods   . Hirsutism    with elevated free testosterone levels  . Hyperinsulinism   . Hypoglycemia    with seizures that seem to respond to glucose  . Nosebleed    with abnormal clotting  studies  . Seizure disorder (HCC)   . Seizures (HCC)    hx of  . Striae   . Tachycardia   . 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  Family History  Problem Relation Age of Onset  . Diabetes Mother   . Hypertension Mother   . Hyperlipidemia Unknown        parent grandparent    Social History   Socioeconomic History  . Marital status: Married    Spouse name: Not on file  . Number of children: Not on file  . Years of education: 89  . Highest education level: Not on file  Occupational History  . Not on file  Social Needs  . Financial resource strain: Not on file  . Food insecurity:    Worry: Not on file    Inability: Not on file  . Transportation needs:    Medical: Not on file    Non-medical: Not on file  Tobacco Use  . Smoking status: Never Smoker  . Smokeless tobacco: Never Used  Substance and Sexual Activity  . Alcohol use: No  . Drug use: No  . Sexual activity: Not on file  Lifestyle  . Physical activity:    Days  per week: Not on file    Minutes per session: Not on file  . Stress: Not on file  Relationships  . Social connections:    Talks on phone: Not on file    Gets together: Not on file    Attends religious service: Not on file    Active member of club or organization: Not on file    Attends meetings of clubs or organizations: Not on file    Relationship status: Not on file  Other Topics Concern  . Not on file  Social History Narrative   hh of  3   [employer]  [employer] gets    Neg tad   6 hours sleep gets speech therapy at school ( no ot pt)   Patient lives at home with her parents.   Caffeine Use: none   No pets     Outpatient Medications Prior to Visit  Medication Sig Dispense Refill  . metFORMIN (GLUCOPHAGE-XR) 750 MG 24 hr tablet TAKE 1 TABLET BY MOUTH DAILY WITH BREAKFAST  (Patient taking differently: TAKE 1 TABLET BY MOUTH TWICE A DAY) 30 tablet  0  . UNABLE TO FIND Med Name: Focus Factor, 2 tablets/day    . methylPREDNISolone acetate (DEPO-MEDROL) injection 40 mg      No facility-administered medications prior to visit.      EXAM:  BP 108/77   Pulse 66   Temp 97.9 F (36.6 C)   Ht 5' 4.75" (1.645 m)   Wt 142 lb (64.4 kg)   BMI 23.81 kg/m   Body mass index is 23.81 kg/m. Wt Readings from Last 3 Encounters:  04/14/18 142 lb (64.4 kg)  01/05/18 140 lb 11.2 oz (63.8 kg)  04/05/17 142 lb 9.6 oz (64.7 kg)    Physical Exam: Vital signs reviewed UJW:[id number]:This is a well-nourished alert cooperative    who appearsr stated age in no acute distress.  willinteract and converse   Abnormal gait  HEENT: normocephalic atraumatic , Eyes: PERRL EOM's full, conjunctiva clear, Nares: paten,t no deformity discharge or tenderness., Ears: no deformity EAC's clear TMs with normal landmarks. Mouth: clear OP, no lesions, edema.  Moist mucous membranes. Dentition in adequate repair. NECK: supple without masses, thyromegaly or bruits. CHEST/PULM:  Clear to auscultation and percussion breath sounds equal no wheeze , rales or rhonchi. No chest wall deformities or tenderness. Breast: normal by inspection . No dimpling, discharge, masses, tenderness or discharge . CV: PMI is nondisplaced, S1 S2 no gallops, murmurs, rubs. Peripheral pulses are full without delay.No JVD .  ABDOMEN: Bowel sounds normal nontender  No guard or rebound, no hepato splenomegal no CVA tenderness.  No hernia. Extremtities:  No clubbing cyanosis or edema, no acute joint swelling or redness no focal atrophy  Mild scoliosis  Some distal  ? Dec mass  NEURO:  Oriented x3, cranial nerves 3-12 appear to be intact, abnormal gait flat foot  Floppy  SKIN: No acute rashes  Right axilla  Nodule normal turgor, color, no bruising or petechiae. PSYCH:  good eye contact, no obvious depression anxiety,  Limited speech and conversation  But pleasant and understanding  Language  LN: no cervical axillary  inguinal adenopathy  Lab Results  Component Value Date   WBC 10.0 01/05/2017   HGB 14.0 01/05/2017   HCT 40.7 01/05/2017   PLT 281 01/05/2017   GLUCOSE 101 (H) 04/14/2018   CHOL 145 04/14/2018   TRIG 105.0 04/14/2018   HDL 50.70 04/14/2018   LDLDIRECT 66.8 05/30/2013  LDLCALC 73 04/14/2018   ALT 16 04/14/2018   AST 14 04/14/2018   NA 137 04/14/2018   K 4.0 04/14/2018   CL 101 04/14/2018   CREATININE 0.69 04/14/2018   BUN 7 04/14/2018   CO2 26 04/14/2018   TSH 1.30 04/14/2018   HGBA1C 4.2 (L) 04/14/2018    BP Readings from Last 3 Encounters:  04/14/18 108/77  01/05/18 122/78  04/05/17 106/70    Lab results reviewed with patient   ASSESSMENT AND PLAN:  Discussed the following assessment and plan:  Visit for preventive health examination  Medication management - Plan: Lipid panel, TSH, T4, free, CBC with Differential/Platelet, Hemoglobin A1c, CMP  Developmental delay disorder  Thyroiditis, autoimmune ? - Plan: Lipid panel, TSH, T4, free, CBC with Differential/Platelet, Hemoglobin A1c, CMP  PCOS (polycystic ovarian syndrome) - metformin bid 750 xr - Plan: Lipid panel, TSH, T4, free, CBC with Differential/Platelet, Hemoglobin A1c, CMP  Hyperinsulinemia - Plan: Lipid panel, TSH, T4, free, CBC with Differential/Platelet, Hemoglobin A1c, CMP  Other idiopathic scoliosis, thoracic region  Intellectual disability Plan rov in novmenber  Patient Care Team: Madelin Headings, MD as PCP - General (Internal Medicine) Talmage Coin, MD as Attending Physician (Endocrinology) Patient Instructions  Looking in to  Support  For autistic disorder   Groups.   Some acitivity good  .    llimit  And  track cut out beverages with sugar in them .   Will notify you  of labs when available.   If all ok we can  seeyou in a year  Or 6 months depending    Burna Mortimer K. Devansh Riese M.D.

## 2018-04-14 ENCOUNTER — Encounter: Payer: Self-pay | Admitting: Internal Medicine

## 2018-04-14 ENCOUNTER — Ambulatory Visit (INDEPENDENT_AMBULATORY_CARE_PROVIDER_SITE_OTHER): Payer: BC Managed Care – PPO | Admitting: Internal Medicine

## 2018-04-14 ENCOUNTER — Other Ambulatory Visit: Payer: BC Managed Care – PPO

## 2018-04-14 VITALS — BP 108/77 | HR 66 | Temp 97.9°F | Ht 64.75 in | Wt 142.0 lb

## 2018-04-14 DIAGNOSIS — E161 Other hypoglycemia: Secondary | ICD-10-CM

## 2018-04-14 DIAGNOSIS — E063 Autoimmune thyroiditis: Secondary | ICD-10-CM | POA: Diagnosis not present

## 2018-04-14 DIAGNOSIS — E282 Polycystic ovarian syndrome: Secondary | ICD-10-CM

## 2018-04-14 DIAGNOSIS — R6250 Unspecified lack of expected normal physiological development in childhood: Secondary | ICD-10-CM

## 2018-04-14 DIAGNOSIS — Z0000 Encounter for general adult medical examination without abnormal findings: Secondary | ICD-10-CM

## 2018-04-14 DIAGNOSIS — M4124 Other idiopathic scoliosis, thoracic region: Secondary | ICD-10-CM | POA: Diagnosis not present

## 2018-04-14 DIAGNOSIS — Z79899 Other long term (current) drug therapy: Secondary | ICD-10-CM | POA: Diagnosis not present

## 2018-04-14 DIAGNOSIS — F79 Unspecified intellectual disabilities: Secondary | ICD-10-CM

## 2018-04-14 LAB — COMPREHENSIVE METABOLIC PANEL
ALBUMIN: 4.4 g/dL (ref 3.5–5.2)
ALT: 16 U/L (ref 0–35)
AST: 14 U/L (ref 0–37)
Alkaline Phosphatase: 72 U/L (ref 39–117)
BUN: 7 mg/dL (ref 6–23)
CHLORIDE: 101 meq/L (ref 96–112)
CO2: 26 mEq/L (ref 19–32)
CREATININE: 0.69 mg/dL (ref 0.40–1.20)
Calcium: 9.7 mg/dL (ref 8.4–10.5)
GFR: 108.32 mL/min (ref >60.00)
GLUCOSE: 101 mg/dL — AB (ref 70–99)
Potassium: 4 mEq/L (ref 3.5–5.1)
Sodium: 137 mEq/L (ref 135–145)
Total Bilirubin: 0.6 mg/dL (ref 0.2–1.2)
Total Protein: 8.5 g/dL — ABNORMAL HIGH (ref 6.0–8.3)

## 2018-04-14 LAB — TSH: TSH: 1.3 u[IU]/mL (ref 0.35–4.50)

## 2018-04-14 LAB — CBC WITH DIFFERENTIAL/PLATELET
BASOS PCT: 0.2 %
Basophils Absolute: 19 cells/uL (ref 0–200)
EOS PCT: 1.2 %
Eosinophils Absolute: 114 cells/uL (ref 15–500)
HCT: 39.6 % (ref 35.0–45.0)
HEMOGLOBIN: 13.9 g/dL (ref 11.7–15.5)
Lymphs Abs: 3021 cells/uL (ref 850–3900)
MCH: 27.6 pg (ref 27.0–33.0)
MCHC: 35.1 g/dL (ref 32.0–36.0)
MCV: 78.7 fL — ABNORMAL LOW (ref 80.0–100.0)
MONOS PCT: 6.9 %
MPV: 11.5 fL (ref 7.5–12.5)
Neutro Abs: 5691 cells/uL (ref 1500–7800)
Neutrophils Relative %: 59.9 %
Platelets: 347 10*3/uL (ref 140–400)
RBC: 5.03 10*6/uL (ref 3.80–5.10)
RDW: 15.4 % — ABNORMAL HIGH (ref 11.0–15.0)
Total Lymphocyte: 31.8 %
WBC: 9.5 10*3/uL (ref 3.8–10.8)
WBCMIX: 656 {cells}/uL (ref 200–950)

## 2018-04-14 LAB — LIPID PANEL
Cholesterol: 145 mg/dL (ref 0–200)
HDL: 50.7 mg/dL (ref >39.00)
LDL Cholesterol: 73 mg/dL (ref 0–99)
NonHDL: 94.49
Total CHOL/HDL Ratio: 3
Triglycerides: 105 mg/dL (ref 0.0–149.0)
VLDL: 21 mg/dL (ref 0.0–40.0)

## 2018-04-14 LAB — HEMOGLOBIN A1C: HEMOGLOBIN A1C: 4.2 % — AB (ref 4.6–6.5)

## 2018-04-14 LAB — T4, FREE: Free T4: 0.94 ng/dL (ref 0.60–1.60)

## 2018-04-14 NOTE — Patient Instructions (Addendum)
Looking in to  Support  For autistic disorder   Groups.   Some acitivity good  .    llimit  And  track cut out beverages with sugar in them .   Will notify you  of labs when available.   If all ok we can  seeyou in a year  Or 6 months depending

## 2018-04-18 ENCOUNTER — Other Ambulatory Visit: Payer: Self-pay | Admitting: Internal Medicine

## 2018-04-18 ENCOUNTER — Telehealth: Payer: Self-pay | Admitting: Internal Medicine

## 2018-04-18 NOTE — Telephone Encounter (Signed)
Pt seen 04/14/18 by Dr Fabian SharpPanosh and was supposed to have Metformin sent to the pharmacy and this was not sent in per pt. It looks as though her med list was updated from Metformin 750mg  1 tablet daily to 1 tablet BID. No Rx was sent at time of visit. A1c was checked same day and results were 4.2   Hemoglobin A1c     Status: Abnormal   Collection Time: 04/14/18 10:48 AM  Result Value Ref Range   Hgb A1c MFr Bld 4.2 (L) 4.6 - 6.5 %    Comment: Glycemic Control Guidelines for People with Diabetes:Non Diabetic:  <6%Goal of Therapy: <7%Additional Action Suggested:  >8%    Pt is completely out of medication today and needs this filled asap but there are no notations in the chart as to how this is supposed to be written -- QD or BID Pt does not want to wait for Dr Fabian SharpPanosh to return 04/19/18  Please advise Dr Clent RidgesFry if you can advise. Thanks.

## 2018-04-18 NOTE — Telephone Encounter (Signed)
Call in Metformin XR 750 mg to take BID, #60 with no rf

## 2018-04-19 NOTE — Telephone Encounter (Signed)
Left detailed message on machine making aware that Rx as been sent to [facility]. Nothing further needed.  Refilled through refill encounter

## 2018-04-25 NOTE — Progress Notes (Signed)
Pt is scheduled for 08/2018 Nothing further needed.

## 2018-05-27 ENCOUNTER — Other Ambulatory Visit: Payer: Self-pay | Admitting: Family Medicine

## 2018-06-13 ENCOUNTER — Other Ambulatory Visit: Payer: Self-pay | Admitting: Family Medicine

## 2018-06-14 ENCOUNTER — Telehealth: Payer: Self-pay | Admitting: Internal Medicine

## 2018-06-14 ENCOUNTER — Other Ambulatory Visit: Payer: Self-pay | Admitting: *Deleted

## 2018-06-14 MED ORDER — METFORMIN HCL ER 750 MG PO TB24: 750.0000 mg | ORAL_TABLET | Freq: Two times a day (BID) | ORAL | 1 refills | Status: DC

## 2018-06-14 NOTE — Telephone Encounter (Signed)
Copied from CRM [id number]. Topic: Quick Communication - Rx Refill/Question >> Jun 14, 2018  2:31 PM Raoul PitchWilliams-Neal, Sade R wrote: Medication: metFORMIN (GLUCOPHAGE-XR) 750 MG 24 hr tablet  Has the patient contacted their pharmacy? Yes, requesting 2 months worth  Preferred Pharmacy (with phone number or street name): [facility] [address] - [place], [place] - [address] [phone] (Phone) [phone] (Fax)   Agent: Please be advised that RX refills may take up to 3 business days. We ask that you follow-up with your pharmacy.

## 2018-06-14 NOTE — Telephone Encounter (Signed)
Rx refilled per protocol- LOV: 04/14/18 and f/u 6 mo. Patient has appointment

## 2018-08-29 ENCOUNTER — Ambulatory Visit (INDEPENDENT_AMBULATORY_CARE_PROVIDER_SITE_OTHER): Payer: BC Managed Care – PPO | Admitting: Internal Medicine

## 2018-08-29 ENCOUNTER — Other Ambulatory Visit: Payer: BC Managed Care – PPO

## 2018-08-29 ENCOUNTER — Encounter: Payer: Self-pay | Admitting: Internal Medicine

## 2018-08-29 VITALS — BP 102/68 | HR 85 | Temp 98.2°F | Wt 145.1 lb

## 2018-08-29 DIAGNOSIS — R6250 Unspecified lack of expected normal physiological development in childhood: Secondary | ICD-10-CM | POA: Diagnosis not present

## 2018-08-29 DIAGNOSIS — E282 Polycystic ovarian syndrome: Secondary | ICD-10-CM

## 2018-08-29 DIAGNOSIS — Z79899 Other long term (current) drug therapy: Secondary | ICD-10-CM

## 2018-08-29 DIAGNOSIS — Z23 Encounter for immunization: Secondary | ICD-10-CM

## 2018-08-29 DIAGNOSIS — E611 Iron deficiency: Secondary | ICD-10-CM

## 2018-08-29 DIAGNOSIS — R7303 Prediabetes: Secondary | ICD-10-CM

## 2018-08-29 LAB — HEMOGLOBIN A1C: HEMOGLOBIN A1C: 4.2 % — AB (ref 4.6–6.5)

## 2018-08-29 LAB — BASIC METABOLIC PANEL
BUN: 8 mg/dL (ref 6–23)
CALCIUM: 9.7 mg/dL (ref 8.4–10.5)
CO2: 28 mEq/L (ref 19–32)
CREATININE: 0.59 mg/dL (ref 0.40–1.20)
Chloride: 100 mEq/L (ref 96–112)
GFR: 129.41 mL/min (ref >60.00)
Glucose, Bld: 122 mg/dL — ABNORMAL HIGH (ref 70–99)
Potassium: 4.2 mEq/L (ref 3.5–5.1)
SODIUM: 136 meq/L (ref 135–145)

## 2018-08-29 LAB — VITAMIN B12: Vitamin B-12: 707 pg/mL (ref 211–911)

## 2018-08-29 NOTE — Patient Instructions (Signed)
Glad you are doing  well.  continue  Exercise   And  Healthy eating   No change in medication  cpx  Next June 2020

## 2018-08-29 NOTE — Progress Notes (Signed)
Chief Complaint  Patient presents with  . Follow-up    Do well! No new concerns    HPI: Elizabeth Cantrell 27 y.o. come in for Chronic disease management  Here with mom  Seems to be doing well. walkking every day goes to [employer]  Sleep late until 9 30 but was on target when went to [place].  Had strep infection  No snoring  Are her tonsils too big?  Check iron and bg  Had pizza for lunch today  No new neuro sx .   has Prediabetes; Fatigue; Anemia; Developmental delay disorder; Acanthosis nigricans; Tachycardia; ANA positive; Thoracic scoliosis; Goiter; Hair thinning; Acne; Iron deficiency; Visit for preventive health examination; PCOS (polycystic ovarian syndrome); Thyroiditis, autoimmune ?; Sleepiness; Sleep trouble; Medication management; Hyperinsulinemia; and Developmental delay on their problem list.  ROS: See pertinent positives and negatives per HPI.  Past Medical History:  Diagnosis Date  . Acne   . Acquired acanthosis nigricans   . Amenorrhea    until beginning glucophage  . ANA positive   . 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 Testing for Rett Syndrome normal.  . Fractured elbow    x 2 with falls  . Heavy periods   . Hirsutism    with elevated free testosterone levels  . Hyperinsulinism   . Hypoglycemia    with seizures that seem to respond to glucose  . Nosebleed    with abnormal clotting studies  . Seizure disorder (HCC)   . Seizures (HCC)    hx of  . Striae   . Tachycardia   . 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 Diabetes Mother   . Hypertension Mother   . Hyperlipidemia Unknown        parent grandparent    Social History   Socioeconomic History  . Marital status: Married    Spouse name: Not on file  . Number of children: Not on file  . Years of education: 23  . Highest education level: Not  on file  Occupational History  . Not on file  Social Needs  . Financial resource strain: Not on file  . Food insecurity:    Worry: Not on file    Inability: Not on file  . Transportation needs:    Medical: Not on file    Non-medical: Not on file  Tobacco Use  . Smoking status: Never Smoker  . Smokeless tobacco: Never Used  Substance and Sexual Activity  . Alcohol use: No  . Drug use: No  . Sexual activity: Not on file  Lifestyle  . Physical activity:    Days per week: Not on file    Minutes per session: Not on file  . Stress: Not on file  Relationships  . Social connections:    Talks on phone: Not on file    Gets together: Not on file    Attends religious service: Not on file    Active member of club or organization: Not on file    Attends meetings of clubs or organizations: Not on file    Relationship status: Not on file  Other Topics Concern  . Not on file  Social History Narrative   hh of  3   [employer]  [name] gets    Neg tad   6 hours sleep  gets speech therapy at school ( no ot pt)   Patient lives at home with her parents.   Caffeine Use: none   No pets     Outpatient Medications Prior to Visit  Medication Sig Dispense Refill  . Biotin 1000 MCG tablet Take 1,000 mcg by mouth daily.    . cyanocobalamin 100 MCG tablet Take 100 mcg by mouth daily.    . metFORMIN (GLUCOPHAGE-XR) 750 MG 24 hr tablet Take 1 tablet (750 mg total) by mouth 2 (two) times daily. 60 tablet 1  . Multiple Vitamins-Minerals (MULTIVITAMIN WITH MINERALS) tablet Take 1 tablet by mouth daily.     No facility-administered medications prior to visit.      EXAM:  BP 102/68 (BP Location: Right Arm, Patient Position: Sitting, Cuff Size: Normal)   Pulse 85   Temp 98.2 F (36.8 C) (Oral)   Wt 145 lb 1.6 oz (65.8 kg)   BMI 24.33 kg/m   Body mass index is 24.33 kg/m.  GENERAL: vitals reviewed and listed above, alert, oriented, appears well hydrated and in no acute  distress HEENT: atraumatic, conjunctiva  clear, no obvious abnormalities on inspection of external nose and ears OP : no lesion edema or exudate  Tonsil 1-2+ good airway  NECK: no obvious masses on inspection palpation  LUNGS: clear to auscultation bilaterally, no wheezes, rales or rhonchi,  CV: HRRR, no clubbing cyanosis or  peripheral edema nl cap refill  MS: moves all extremities without noticeable focal  Abnormality Abdomen:  Sof,t normal bowel sounds without hepatosplenomegaly, no guarding rebound or masses no CVA tendernesss   thinning hair no acute rashes  PSYCH: pleasant and cooperative,  Lab Results  Component Value Date   WBC 9.5 04/14/2018   HGB 13.9 04/14/2018   HCT 39.6 04/14/2018   PLT 347 04/14/2018   GLUCOSE 101 (H) 04/14/2018   CHOL 145 04/14/2018   TRIG 105.0 04/14/2018   HDL 50.70 04/14/2018   LDLDIRECT 66.8 05/30/2013   LDLCALC 73 04/14/2018   ALT 16 04/14/2018   AST 14 04/14/2018   NA 137 04/14/2018   K 4.0 04/14/2018   CL 101 04/14/2018   CREATININE 0.69 04/14/2018   BUN 7 04/14/2018   CO2 26 04/14/2018   TSH 1.30 04/14/2018   HGBA1C 4.2 (L) 04/14/2018   BP Readings from Last 3 Encounters:  08/29/18 102/68  04/14/18 108/77  01/05/18 122/78   Wt Readings from Last 3 Encounters:  08/29/18 145 lb 1.6 oz (65.8 kg)  04/14/18 142 lb (64.4 kg)  01/05/18 140 lb 11.2 oz (63.8 kg)     ASSESSMENT AND PLAN:  Discussed the following assessment and plan:  PCOS (polycystic ovarian syndrome) - Plan: CBC with Differential/Platelet, Hemoglobin A1c, Basic metabolic panel, Iron, TIBC and Ferritin Panel, Vitamin B12  Need for influenza vaccination - Plan: Flu Vaccine QUAD 6+ mos PF IM (Fluarix Quad PF)  Prediabetes - Plan: CBC with Differential/Platelet, Hemoglobin A1c, Basic metabolic panel, Iron, TIBC and Ferritin Panel, Vitamin B12  Medication management - Plan: CBC with Differential/Platelet, Hemoglobin A1c, Basic metabolic panel, Iron, TIBC and Ferritin  Panel, Vitamin B12  Iron deficiency - Plan: CBC with Differential/Platelet, Hemoglobin A1c, Basic metabolic panel, Iron, TIBC and Ferritin Panel, Vitamin B12  Developmental delay disorder   Iron f u sugar monitoring  Post eating today   If ok then  cpx in June or earlier if needed   -Patient advised to return or notify health care team  if  new concerns arise.  Patient Instructions  Glad you are doing  well.  continue  Exercise   And  Healthy eating   No change in medication  cpx  Next June 2020      Neta Mends. Panosh M.D.

## 2018-08-30 LAB — CBC WITH DIFFERENTIAL/PLATELET
BASOS PCT: 0.3 %
Basophils Absolute: 35 cells/uL (ref 0–200)
EOS ABS: 94 {cells}/uL (ref 15–500)
Eosinophils Relative: 0.8 %
HEMATOCRIT: 40.8 % (ref 35.0–45.0)
HEMOGLOBIN: 13.9 g/dL (ref 11.7–15.5)
Lymphs Abs: 3299 cells/uL (ref 850–3900)
MCH: 27.9 pg (ref 27.0–33.0)
MCHC: 34.1 g/dL (ref 32.0–36.0)
MCV: 81.9 fL (ref 80.0–100.0)
MPV: 12.4 fL (ref 7.5–12.5)
Monocytes Relative: 3.7 %
Neutro Abs: 7839 cells/uL — ABNORMAL HIGH (ref 1500–7800)
Neutrophils Relative %: 67 %
Platelets: 345 10*3/uL (ref 140–400)
RBC: 4.98 10*6/uL (ref 3.80–5.10)
RDW: 14.9 % (ref 11.0–15.0)
Total Lymphocyte: 28.2 %
WBC: 11.7 10*3/uL — ABNORMAL HIGH (ref 3.8–10.8)
WBCMIX: 433 {cells}/uL (ref 200–950)

## 2018-08-30 LAB — IRON,TIBC AND FERRITIN PANEL
%SAT: 14 % — AB (ref 16–45)
FERRITIN: 14 ng/mL — AB (ref 16–154)
IRON: 49 ug/dL (ref 40–190)
TIBC: 351 mcg/dL (calc) (ref 250–450)

## 2019-01-23 ENCOUNTER — Other Ambulatory Visit: Payer: Self-pay | Admitting: Internal Medicine

## 2019-01-29 ENCOUNTER — Other Ambulatory Visit: Payer: Self-pay | Admitting: Internal Medicine

## 2019-02-16 ENCOUNTER — Other Ambulatory Visit: Payer: Self-pay | Admitting: Internal Medicine

## 2019-04-17 ENCOUNTER — Encounter: Payer: BC Managed Care – PPO | Admitting: Internal Medicine

## 2019-08-04 ENCOUNTER — Other Ambulatory Visit: Payer: Self-pay | Admitting: Internal Medicine

## 2019-09-24 ENCOUNTER — Other Ambulatory Visit: Payer: Self-pay | Admitting: Internal Medicine

## 2019-09-28 ENCOUNTER — Ambulatory Visit (INDEPENDENT_AMBULATORY_CARE_PROVIDER_SITE_OTHER): Payer: BC Managed Care – PPO | Admitting: Internal Medicine

## 2019-09-28 ENCOUNTER — Encounter: Payer: Self-pay | Admitting: Internal Medicine

## 2019-09-28 ENCOUNTER — Other Ambulatory Visit: Payer: Self-pay

## 2019-09-28 VITALS — BP 120/64 | HR 72 | Temp 97.8°F | Ht 65.0 in | Wt 138.4 lb

## 2019-09-28 DIAGNOSIS — R7303 Prediabetes: Secondary | ICD-10-CM | POA: Diagnosis not present

## 2019-09-28 DIAGNOSIS — E063 Autoimmune thyroiditis: Secondary | ICD-10-CM | POA: Diagnosis not present

## 2019-09-28 DIAGNOSIS — R6250 Unspecified lack of expected normal physiological development in childhood: Secondary | ICD-10-CM

## 2019-09-28 DIAGNOSIS — F79 Unspecified intellectual disabilities: Secondary | ICD-10-CM | POA: Diagnosis not present

## 2019-09-28 DIAGNOSIS — Z79899 Other long term (current) drug therapy: Secondary | ICD-10-CM | POA: Diagnosis not present

## 2019-09-28 DIAGNOSIS — E282 Polycystic ovarian syndrome: Secondary | ICD-10-CM | POA: Diagnosis not present

## 2019-09-28 DIAGNOSIS — Z0000 Encounter for general adult medical examination without abnormal findings: Secondary | ICD-10-CM

## 2019-09-28 DIAGNOSIS — R21 Rash and other nonspecific skin eruption: Secondary | ICD-10-CM | POA: Diagnosis not present

## 2019-09-28 LAB — HEPATIC FUNCTION PANEL
ALT: 19 U/L (ref 0–35)
AST: 14 U/L (ref 0–37)
Albumin: 4.4 g/dL (ref 3.5–5.2)
Alkaline Phosphatase: 73 U/L (ref 39–117)
Bilirubin, Direct: 0.1 mg/dL (ref 0.0–0.3)
Total Bilirubin: 0.6 mg/dL (ref 0.2–1.2)
Total Protein: 7.9 g/dL (ref 6.0–8.3)

## 2019-09-28 LAB — CBC WITH DIFFERENTIAL/PLATELET
Basophils Absolute: 0.1 10*3/uL (ref 0.0–0.1)
Basophils Relative: 0.6 % (ref 0.0–3.0)
Eosinophils Absolute: 0.1 10*3/uL (ref 0.0–0.7)
Eosinophils Relative: 1.5 % (ref 0.0–5.0)
HCT: 38.6 % (ref 36.0–46.0)
Hemoglobin: 13.9 g/dL (ref 12.0–15.0)
Lymphocytes Relative: 33.3 % (ref 12.0–46.0)
Lymphs Abs: 3.2 10*3/uL (ref 0.7–4.0)
MCHC: 36 g/dL (ref 30.0–36.0)
MCV: 79.7 fl (ref 78.0–100.0)
Monocytes Absolute: 0.5 10*3/uL (ref 0.1–1.0)
Monocytes Relative: 4.9 % (ref 3.0–12.0)
Neutro Abs: 5.7 10*3/uL (ref 1.4–7.7)
Neutrophils Relative %: 59.7 % (ref 43.0–77.0)
Platelets: 300 10*3/uL (ref 150.0–400.0)
RBC: 4.84 Mil/uL (ref 3.87–5.11)
RDW: 15.7 % — ABNORMAL HIGH (ref 11.5–15.5)
WBC: 9.5 10*3/uL (ref 4.0–10.5)

## 2019-09-28 LAB — HEMOGLOBIN A1C: Hgb A1c MFr Bld: 4 % — ABNORMAL LOW (ref 4.6–6.5)

## 2019-09-28 LAB — LIPID PANEL
Cholesterol: 165 mg/dL (ref 0–200)
HDL: 46 mg/dL (ref >39.00)
LDL Cholesterol: 86 mg/dL (ref 0–99)
NonHDL: 118.56
Total CHOL/HDL Ratio: 4
Triglycerides: 162 mg/dL — ABNORMAL HIGH (ref 0.0–149.0)
VLDL: 32.4 mg/dL (ref 0.0–40.0)

## 2019-09-28 LAB — BASIC METABOLIC PANEL
BUN: 8 mg/dL (ref 6–23)
CO2: 26 mEq/L (ref 19–32)
Calcium: 9.5 mg/dL (ref 8.4–10.5)
Chloride: 101 mEq/L (ref 96–112)
Creatinine, Ser: 0.61 mg/dL (ref 0.40–1.20)
GFR: 116.26 mL/min (ref >60.00)
Glucose, Bld: 89 mg/dL (ref 70–99)
Potassium: 4.3 mEq/L (ref 3.5–5.1)
Sodium: 137 mEq/L (ref 135–145)

## 2019-09-28 LAB — T4, FREE: Free T4: 0.98 ng/dL (ref 0.60–1.60)

## 2019-09-28 LAB — TSH: TSH: 2.55 u[IU]/mL (ref 0.35–4.50)

## 2019-09-28 MED ORDER — KETOCONAZOLE 2 % EX SHAM: MEDICATED_SHAMPOO | CUTANEOUS | 2 refills | Status: DC

## 2019-09-28 MED ORDER — METFORMIN HCL 500 MG PO TABS: 500.0000 mg | ORAL_TABLET | Freq: Three times a day (TID) | ORAL | 3 refills | Status: DC

## 2019-09-28 NOTE — Progress Notes (Addendum)
This visit occurred during the SARS-CoV-2 public health emergency.  Safety protocols were in place, including screening questions prior to the visit, additional usage of staff PPE, and extensive cleaning of exam room while observing appropriate contact time as indicated for disinfecting solutions.    Chief Complaint  Patient presents with   Annual Exam    HPI: Patient  Elizabeth Cantrell  28 y.o. comes in today for Preventive Health Care visit  Elizabeth Cantrell is a 28 year old female with PCO S and developmental delay condition coming in with her mom today.  She is here for her physical yearly visit. Since Covid hits she and the family been walking more and eating a bit more healthy no major complaints.  Mom did a little acupuncture on her with some type of chest symptom. No major changes hearing vision sleep is always been disrupted and difficult.  No major changes. She has been taking Metformin but the extended release has been recalled. Declined Pap smear to get flu vaccine at [facility] would rather get the flu shot there. Has had a rash off and on for weeks to months not very itchy anterior chest and blotches.  Wonders if it could be a fungus.  Also and had small bumps on the lower extremities and patches thought it could be bug bites or fleas as mom and had some also in the legs.  Currently it is fading. Health Maintenance  Topic Date Due   HIV Screening  02/06/2006   INFLUENZA VACCINE  05/26/2019   PAP SMEAR-Modifier  09/28/2019 (Originally 02/07/2012)   PAP-Cervical Cytology Screening  09/27/2020 (Originally 02/07/2012)   TETANUS/TDAP  05/31/2023   Health Maintenance Review LIFESTYLE:  Exercise:   Better  Cause mom  Not working  Tobacco/ETS: n Alcohol:  n Sugar beverages:tea  Water  Sleep: always a problem .  Drug use: no HH of  3  No pets .  Work: no    ROS:  GEN/ HEENT: No fever, significant weight changes sweats headaches vision problems hearing changes, CV/ PULM; No chest  pain shortness of breath cough, syncope,edema  change in exercise tolerance. GI /GU: No adominal pain, vomiting, change in bowel habits. No blood in the stool. No significant GU symptoms. SKIN/HEME: ,no acute skin rashes suspicious lesions or bleeding. No lymphadenopathy, nodules, masses.  NEURO/ PSYCH:  No neurologic signs such as weakness numbness. No depression anxiety. IMM/ Allergy: No unusual infections.  Allergy .   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 Testing for Rett Syndrome normal.   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 No pertinent surgical history.  Family History  Problem Relation Age of Onset   Diabetes  Mother    Hypertension Mother    Hyperlipidemia Unknown        parent grandparent    Social History   Socioeconomic History   Marital status: Married    Spouse name: Not on file   Number of children: Not on file   Years of education: 12   Highest education level: Not on file  Occupational History   Not on file  Social Needs   Financial resource strain: Not on file   Food insecurity    Worry: Not on file    Inability: Not on file   Transportation needs    Medical: Not on file    Non-medical: Not on file  Tobacco Use   Smoking status: Never Smoker   Smokeless tobacco: Never Used    Substance and Sexual Activity   Alcohol use: No   Drug use: No   Sexual activity: Not on file  Lifestyle   Physical activity    Days per week: Not on file    Minutes per session: Not on file   Stress: Not on file  Relationships   Social connections    Talks on phone: Not on file    Gets together: Not on file    Attends religious service: Not on file    Active member of club or organization: Not on file    Attends meetings of clubs or organizations: Not on file    Relationship status: Not on file  Other Topics Concern   Not on file  Social History Narrative   hh of  3   [employer]  [employer] [place] gets    Neg tad   6 hours sleep gets speech therapy at school ( no ot pt)   Patient lives at home with her parents.   Caffeine Use: none   No pets     Outpatient Medications Prior to Visit  Medication Sig Dispense Refill   Biotin 1000 MCG tablet Take 1,000 mcg by mouth daily.     cyanocobalamin 100 MCG tablet Take 100 mcg by mouth daily.     Multiple Vitamins-Minerals (MULTIVITAMIN WITH MINERALS) tablet Take 1 tablet by mouth daily.     metFORMIN (GLUCOPHAGE-XR) 750 MG 24 hr tablet TAKE ONE TABLET BY MOUTH TWICE DAILY  60 tablet 0   No facility-administered medications prior to visit.      EXAM:  BP 120/64 (BP Location: Right Arm, Patient Position: Sitting, Cuff Size: Normal)    Pulse 72    Temp 97.8 F (36.6 C) (Temporal)    Ht 5\' 5"  (1.651 m)    Wt 138 lb 6.4 oz (62.8 kg)    SpO2 99%    BMI 23.03 kg/m   Body mass index is 23.03 kg/m. Wt Readings from Last 3 Encounters:  09/28/19 138 lb 6.4 oz (62.8 kg)  08/29/18 145 lb 1.6 oz (65.8 kg)  04/14/18 142 lb (64.4 kg)    Physical Exam: Vital signs reviewed ZOX:WRUEGEN:This is a well-developed well-nourished alert cooperative    who appearsr stated age in no acute distress.  HEENT: normocephalic atraumatic , Eyes: PERRL EOM's difficult to evaluate, conjunctiva clear, Nares: paten,t no deformity discharge or  tenderness., Ears: no deformity EAC's clear TMs with normal landmarks. Mouth: Masked today NECK: supple without masses, thyromegaly or bruits. CHEST/PULM:  Clear to auscultation and percussion breath sounds equal no wheeze , rales or rhonchi. No chest wall deformities or tenderness. Breast: normal by inspection . No dimpling, discharge, masses, tenderness or  discharge . CV: PMI is nondisplaced, S1 S2 no gallops, murmurs, rubs. Peripheral pulses are full without delay.No JVD .  ABDOMEN: Bowel sounds normal nontender  No guard or rebound, no hepato splenomegal no CVA tenderness.   Extremtities:  No clubbing cyanosis or edema, no acute joint swelling or redness NEURO:  Oriented x3, No new findings verbal interactive but mom has to give detailed answers to questions.  SKIN: Mildly hyperpigmented salmon-colored patches almost ovoid anterior chest without significant scale back is clear lower extremity shows faded dry large irregular patch left lower extremity shin.  E. PSYCH: Oriented, good eye contact, no obvious depression anxiety, LN: no cervical axillary inguinal adenopathy  Lab Results  Component Value Date   WBC 11.7 (H) 08/29/2018   HGB 13.9 08/29/2018   HCT 40.8 08/29/2018   PLT 345 08/29/2018   GLUCOSE 122 (H) 08/29/2018   CHOL 145 04/14/2018   TRIG 105.0 04/14/2018   HDL 50.70 04/14/2018   LDLDIRECT 66.8 05/30/2013   LDLCALC 73 04/14/2018   ALT 16 04/14/2018   AST 14 04/14/2018   NA 136 08/29/2018   K 4.2 08/29/2018   CL 100 08/29/2018   CREATININE 0.59 08/29/2018   BUN 8 08/29/2018   CO2 28 08/29/2018   TSH 1.30 04/14/2018   HGBA1C 4.2 (L) 08/29/2018    BP Readings from Last 3 Encounters:  09/28/19 120/64  08/29/18 102/68  04/14/18 108/77    ASSESSMENT AND PLAN:  Discussed the following assessment and plan:    ICD-10-CM   1. Visit for preventive health examination  Z00.00 Basic metabolic panel    CBC with Differential    Hemoglobin A1c    Hepatic function panel      Lipid panel    TSH    T4, Free (Thyrox)  2. Medication management  Z79.899 Basic metabolic panel    CBC with Differential    Hemoglobin A1c    Hepatic function panel    Lipid panel    TSH    T4, Free (Thyrox)  3. Prediabetes  R73.03 Basic metabolic panel    CBC with Differential    Hemoglobin A1c    Hepatic function panel    Lipid panel    TSH    T4, Free (Thyrox)  4. Developmental delay disorder  R62.50 Basic metabolic panel    CBC with Differential    Hemoglobin A1c    Hepatic function panel    Lipid panel    TSH    T4, Free (Thyrox)  5. 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 PCOS (polycystic ovarian syndrome)  E28.2 Basic metabolic panel    CBC with Differential    Hemoglobin A1c    Hepatic function panel    Lipid panel    TSH    T4, Free (Thyrox)  7. Intellectual disability  F79   8. Rash  R21    Doing better weight loss in a healthy manner We will switch Metformin to immediate release 500 mg to take 3 a day spread out. Lab today Pap declined low to 0 risk Plan follow-up depending on results 6 to 12 months and how she is doing. We did discuss Covid vaccine when it comes there we will get flu vaccines at [facility]. Rash could be atypical tinea versicolor or other.  Trial of ketoconazole shampoo treatment.  Interesting that it is basically on the front of her chest and on the back but in the proper distribution otherwise. Patient Care Team: Kyani Simkin, Neta Mends, MD as PCP - General (Internal Medicine) Talmage Coin, MD as Attending Physician (Endocrinology) Patient Instructions   Changing   Metformin  ER to  Immediate release   500 mg 3 per day  We give fluarix  GSK vaccine  Quadrivalent.   Continue lifestyle intervention healthy eating and exercise .  Will notify you  of labs when available.  Plan rov dending on lab s  Or 6-12  months   rashs could be  Tinea versicolor  And can use topical   Plan follow up   Tinea Versicolor  Tinea versicolor is a common fungal infection of the skin. It causes a rash that appears as light or dark patches on the skin. The rash most often occurs on the chest, back, neck, or upper arms. This condition is more common during warm weather. Other than affecting how your skin looks, tinea versicolor usually does not cause other problems. In most cases, the infection goes away in a few weeks with treatment. It may take a few months for the patches on your skin to return to your usual skin color. What are the causes? This condition occurs when a type of fungus that is normally present on the skin starts to overgrow. This fungus is a kind of yeast. The exact cause of the overgrowth is not known. This condition cannot be passed from one person to another (it is not contagious). What increases the risk? This condition is more likely to develop when certain factors are present, such as:  Heat and humidity.  Sweating too much.  Hormone changes.  Oily skin.  A weak disease-fighting system (immunesystem). What are the signs or symptoms? Symptoms of this condition include:  A rash of light or dark patches on your skin. The rash may have: ? Patches of tan or pink spots (on light skin). ? Patches of white or brown spots (on dark skin). ? Patches of skin that do not tan. ? Well-marked edges. ? Scales on the discolored areas.  Mild itching. How is this diagnosed? A health care provider can usually diagnose this condition by looking at your skin. During the exam, he or she may use ultraviolet (UV) light to see how much of your skin has been affected. In some cases, a skin sample may be taken by scraping the rash. This sample will be viewed under a microscope to check for yeast overgrowth. How is this treated? Treatment for this condition may include:  Dandruff shampoo that is applied to  the affected skin during showers or bathing.  Over-the-counter medicated skin cream, lotion, or soaps.  Prescription antifungal medicine in the form of skin cream or pills.  Medicine to help reduce itching. Follow these instructions at home:  Take over-the-counter and prescription medicines only as told by your health care provider.  Apply dandruff shampoo to the affected area if your health care provider told you to do that. You may be instructed to scrub the affected skin for several minutes each day.  Do not scratch the affected area of skin.  Avoid hot and humid conditions.  Do not use tanning booths.  Try to avoid sweating a lot. Contact a health care provider if:  Your symptoms get worse.  You have a fever.  You have redness, swelling, or pain at the site  of your rash.  You have fluid or blood coming from your rash.  Your rash feels warm to the touch.  You have pus or a bad smell coming from your rash.  Your rash returns (recurs) after treatment. Summary  Tinea versicolor is a common fungal infection of the skin. It causes a rash that appears as light or dark patches on the skin.  The rash most often occurs on the chest, back, neck, or upper arms.  A health care provider can usually diagnose this condition by looking at your skin.  Treatment may include applying shampoo to the skin and taking or applying medicines. This information is not intended to replace advice given to you by your health care provider. Make sure you discuss any questions you have with your health care provider. Document Released: 10/08/2000 Document Revised: 09/23/2017 Document Reviewed: 06/14/2017 Elsevier Patient Education  2020 [place] Elizabeth Cantrell M.D.

## 2019-09-28 NOTE — Patient Instructions (Addendum)
Changing   Metformin  ER to  Immediate release   500 mg 3 per day  We give fluarix  GSK vaccine  Quadrivalent.   Continue lifestyle intervention healthy eating and exercise .  Will notify you  of labs when available.  Plan rov dending on lab s  Or 6-12 months   rashs could be  Tinea versicolor  And can use topical   Plan follow up   Tinea Versicolor  Tinea versicolor is a common fungal infection of the skin. It causes a rash that appears as light or dark patches on the skin. The rash most often occurs on the chest, back, neck, or upper arms. This condition is more common during warm weather. Other than affecting how your skin looks, tinea versicolor usually does not cause other problems. In most cases, the infection goes away in a few weeks with treatment. It may take a few months for the patches on your skin to return to your usual skin color. What are the causes? This condition occurs when a type of fungus that is normally present on the skin starts to overgrow. This fungus is a kind of yeast. The exact cause of the overgrowth is not known. This condition cannot be passed from one person to another (it is not contagious). What increases the risk? This condition is more likely to develop when certain factors are present, such as:  Heat and humidity.  Sweating too much.  Hormone changes.  Oily skin.  A weak disease-fighting system (immunesystem). What are the signs or symptoms? Symptoms of this condition include:  A rash of light or dark patches on your skin. The rash may have: ? Patches of tan or pink spots (on light skin). ? Patches of white or brown spots (on dark skin). ? Patches of skin that do not tan. ? Well-marked edges. ? Scales on the discolored areas.  Mild itching. How is this diagnosed? A health care provider can usually diagnose this condition by looking at your skin. During the exam, he or she may use ultraviolet (UV) light to see how much of your skin has  been affected. In some cases, a skin sample may be taken by scraping the rash. This sample will be viewed under a microscope to check for yeast overgrowth. How is this treated? Treatment for this condition may include:  Dandruff shampoo that is applied to the affected skin during showers or bathing.  Over-the-counter medicated skin cream, lotion, or soaps.  Prescription antifungal medicine in the form of skin cream or pills.  Medicine to help reduce itching. Follow these instructions at home:  Take over-the-counter and prescription medicines only as told by your health care provider.  Apply dandruff shampoo to the affected area if your health care provider told you to do that. You may be instructed to scrub the affected skin for several minutes each day.  Do not scratch the affected area of skin.  Avoid hot and humid conditions.  Do not use tanning booths.  Try to avoid sweating a lot. Contact a health care provider if:  Your symptoms get worse.  You have a fever.  You have redness, swelling, or pain at the site of your rash.  You have fluid or blood coming from your rash.  Your rash feels warm to the touch.  You have pus or a bad smell coming from your rash.  Your rash returns (recurs) after treatment. Summary  Tinea versicolor is a common fungal infection of the skin.  It causes a rash that appears as light or dark patches on the skin.  The rash most often occurs on the chest, back, neck, or upper arms.  A health care provider can usually diagnose this condition by looking at your skin.  Treatment may include applying shampoo to the skin and taking or applying medicines. This information is not intended to replace advice given to you by your health care provider. Make sure you discuss any questions you have with your health care provider. Document Released: 10/08/2000 Document Revised: 09/23/2017 Document Reviewed: 06/14/2017 Elsevier Patient Education  2020  [employer].

## 2019-10-21 ENCOUNTER — Other Ambulatory Visit: Payer: Self-pay | Admitting: Internal Medicine

## 2019-12-28 ENCOUNTER — Telehealth: Payer: Self-pay | Admitting: Internal Medicine

## 2019-12-28 NOTE — Telephone Encounter (Signed)
Pt's mother, Shikira Folino, saw on the news that people with mental disabilities are considered priorities for the covid vaccine and she is wondering if this is true? And if so they would like assistance with getting the vaccine?   She already put the pt on the [facility] waiting list.   Burtis Junes can be reached at [phone]

## 2019-12-28 NOTE — Telephone Encounter (Signed)
Spoke with pts mom and explained that the only thing that can be done is being put on the waiting list

## 2020-10-14 NOTE — Progress Notes (Signed)
Chief Complaint  Patient presents with  . Annual Exam    HPI: Patient  Elizabeth Cantrell  29 y.o. comes in today for Preventive Health Care visit and follow-up medical conditions medications with her mom. She supposed to be taking Metformin 500 mg 3 times daily but mom thinks she often forgets the other doses. Seems to be doing well as far as hyperinsulinemia mom states that her darkened skin has improved. She still has hair loss on the top of her head. Periods are reasonably regular somewhat heavy otherwise.   Health Maintenance  Topic Date Due  . Hepatitis C Screening  Never done  . HIV Screening  Never done  . PAP-Cervical Cytology Screening  Never done  . PAP SMEAR-Modifier  Never done  . INFLUENZA VACCINE  12/08/2020 (Originally 05/25/2020)  . TETANUS/TDAP  05/31/2023   Health Maintenance Review LIFESTYLE:  Exercise: Walks daily about 30 minutes Tobacco/ETS: No Alcohol: No Sugar beverages: No Sleep: Sleeps a good bit especially incidence of Covid Drug use: no HH of 3 Monthly  Periods    ROS: See HPI she has lots of belching but no weight loss vomiting or diarrhea of note mom has some GI distress stress and she is going to see the gastroenterologist. GEN/ HEENT: No fever, significant weight changes sweats headaches vision problems hearing changes, CV/ PULM; No chest pain shortness of breath cough, syncope,edema  change in exercise tolerance. GI /GU: No adominal pain, vomiting, change in bowel habits. No blood in the stool. No significant GU symptoms. SKIN/HEME: ,no acute skin rashes suspicious lesions or bleeding. No lymphadenopathy, nodules, masses.  NEURO/ PSYCH:  No neurologic signs such as weakness numbness. No depression anxiety. IMM/ Allergy: No unusual infections.  Allergy .   REST of 12 system review negative except as per HPI   Past Medical History:  Diagnosis Date  . Acne   . Acquired acanthosis nigricans   . Amenorrhea    until beginning glucophage  . ANA  positive   . 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 Testing for Rett Syndrome normal.  . Fractured elbow    x 2 with falls  . Heavy periods   . Hirsutism    with elevated free testosterone levels  . Hyperinsulinism   . Hypoglycemia    with seizures that seem to respond to glucose  . Nosebleed    with abnormal clotting studies  . Seizure disorder (HCC)   . Seizures (HCC)    hx of  . Striae   . Tachycardia   . 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 No pertinent surgical history.  Family History  Problem Relation Age of Onset  . Diabetes Mother   . Hypertension Mother   . Hyperlipidemia Unknown        parent grandparent    Social History   Socioeconomic History  . Marital status: Single    Spouse name: Not on file  . Number of children: Not on file  . Years of education: 15  . Highest education level: Not on file  Occupational History  . Not on file  Tobacco Use  . Smoking status: Never Smoker  . Smokeless tobacco: Never Used  Vaping Use  . Vaping Use: Never used  Substance and Sexual Activity  . Alcohol use: No  . Drug use: No  . Sexual activity: Not on file  Other Topics Concern  . Not on file  Social History Narrative   hh of  3   [employer]  [name] gets    Neg tad   6 hours sleep gets speech therapy at school ( no ot pt)   Patient lives at home with her parents.   Caffeine Use: none   No pets    Social Determinants of Corporate investment banker Strain: Not on file  Food Insecurity: Not on file  Transportation Needs: Not on file  Physical Activity: Not on file  Stress: Not on file  Social Connections: Not on file    Outpatient Medications Prior to Visit  Medication Sig Dispense Refill  . Biotin 1000 MCG tablet Take 1,000 mcg by mouth daily.    . cyanocobalamin 100 MCG tablet Take 100 mcg by mouth  daily.    Marland Kitchen ketoconazole (NIZORAL) 2 % shampoo Lather to skin of affected area . Rinse after 5-10 minutes  Repeat daily for 3-5 days and then as needed 120 mL 2  . Multiple Vitamins-Minerals (MULTIVITAMIN WITH MINERALS) tablet Take 1 tablet by mouth daily.    . metFORMIN (GLUCOPHAGE) 500 MG tablet Take 1 tablet (500 mg total) by mouth 3 (three) times daily. 270 tablet 3   No facility-administered medications prior to visit.     EXAM:  BP 118/72   Pulse 74   Temp 98.1 F (36.7 C) (Oral)   Ht 5\' 5"  (1.651 m)   Wt 136 lb (61.7 kg)   SpO2 98%   BMI 22.63 kg/m   Body mass index is 22.63 kg/m. Wt Readings from Last 3 Encounters:  10/15/20 136 lb (61.7 kg)  09/28/19 138 lb 6.4 oz (62.8 kg)  08/29/18 145 lb 1.6 oz (65.8 kg)    Physical Exam: Vital signs reviewed 13/05/19 is a well-developed well-nourished alert cooperative    who appearsr stated age in no acute distress.  HEENT: \ atraumatic , Eyes: PERRL EOM's full, conjunctiva clear, Nares: paten,t no deformity discharge or tenderness., Ears: no deformity EAC's clear TMs with normal landmarks. Mouth: Masked NECK: supple without masses, t no bruits thyroid palpable CHEST/PULM:  Clear to auscultation and percussion breath sounds equal no wheeze , rales or rhonchi. No chest wall deformities or tenderness. Breast: normal by inspection . No dimpling, discharge, masses, tenderness or discharge . CV: PMI is nondisplaced, S1 S2 no gallops, murmurs, rubs. Peripheral pulses are full without delay.No JVD .  ABDOMEN: Bowel sounds normal nontender  No guard or rebound, no hepato splenomegal no CVA tenderness.   Extremtities:  No clubbing cyanosis or edema, no acute joint swelling or redness no focal atrophy NEURO: Verbal looks to mom for a number of answers cranial nerves 3-12 appear to be intact, no obvious focal weakness, gait toe out slightly wide-based SKIN: No acute rashes normal turgor, color, no bruising or petechiae.  Slight midline hair  abdomen scalp without scarring obvious but top of head has significant thinning hair but no alopecia patches. PSYCH: Cooperative and pleasant, good eye contact, no obvious depression anxiety,  LN: no cervical axillary inguinal adenopathy  Lab Results  Component Value Date   WBC 9.5 09/28/2019   HGB 13.9 09/28/2019   HCT 38.6 09/28/2019   PLT 300.0 09/28/2019   GLUCOSE 89 09/28/2019   CHOL 165 09/28/2019   TRIG 162.0 (H) 09/28/2019   HDL 46.00 09/28/2019   LDLDIRECT 66.8 05/30/2013   LDLCALC 86 09/28/2019   ALT 19 09/28/2019  AST 14 09/28/2019   NA 137 09/28/2019   K 4.3 09/28/2019   CL 101 09/28/2019   CREATININE 0.61 09/28/2019   BUN 8 09/28/2019   CO2 26 09/28/2019   TSH 2.55 09/28/2019   HGBA1C 4.0 (L) 09/28/2019    BP Readings from Last 3 Encounters:  10/15/20 118/72  09/28/19 120/64  08/29/18 102/68    Lab plan reviewed with patient   ASSESSMENT AND PLAN:  Discussed the following assessment and plan:    ICD-10-CM   1. Visit for preventive health examination  Z00.00 TSH    Hepatic function panel    Hemoglobin A1c    Lipid panel    BASIC METABOLIC PANEL WITH GFR    CBC with Differential/Platelet    T4, free    DHEA-sulfate    Testosterone    Thyroid Peroxidase Antibody    IBC + Ferritin    Celiac Disease Comprehensive Panel with Reflexes    TSH    Hepatic function panel    Hemoglobin A1c    Lipid panel    BASIC METABOLIC PANEL WITH GFR    CBC with Differential/Platelet    T4, free    DHEA-sulfate    Testosterone    Thyroid Peroxidase Antibody    Celiac Disease Comprehensive Panel with Reflexes  2. Medication management  Z79.899 TSH    Hepatic function panel    Hemoglobin A1c    Lipid panel    BASIC METABOLIC PANEL WITH GFR    CBC with Differential/Platelet    T4, free    DHEA-sulfate    Testosterone    Thyroid Peroxidase Antibody    IBC + Ferritin    TSH    Hepatic function panel    Hemoglobin A1c    Lipid panel    BASIC METABOLIC  PANEL WITH GFR    CBC with Differential/Platelet    T4, free    DHEA-sulfate    Testosterone    Thyroid Peroxidase Antibody  3. Hair thinning  L65.9 TSH    Hepatic function panel    Hemoglobin A1c    Lipid panel    BASIC METABOLIC PANEL WITH GFR    CBC with Differential/Platelet    T4, free    DHEA-sulfate    Testosterone    Thyroid Peroxidase Antibody    IBC + Ferritin    TSH    Hepatic function panel    Hemoglobin A1c    Lipid panel    BASIC METABOLIC PANEL WITH GFR    CBC with Differential/Platelet    T4, free    DHEA-sulfate    Testosterone    Thyroid Peroxidase Antibody  4. Developmental delay disorder  R62.50 Thyroid Peroxidase Antibody    IBC + Ferritin    Thyroid Peroxidase Antibody  5. Prediabetes  R73.03 TSH    Hepatic function panel    Hemoglobin A1c    Lipid panel    BASIC METABOLIC PANEL WITH GFR    CBC with Differential/Platelet    T4, free    DHEA-sulfate    Testosterone    Thyroid Peroxidase Antibody    IBC + Ferritin    TSH    Hepatic function panel    Hemoglobin A1c    Lipid panel    BASIC METABOLIC PANEL WITH GFR    CBC with Differential/Platelet    T4, free    DHEA-sulfate    Testosterone    Thyroid Peroxidase Antibody  6. PCOS (polycystic ovarian syndrome)  E28.2 TSH  Hepatic function panel    Hemoglobin A1c    Lipid panel    BASIC METABOLIC PANEL WITH GFR    CBC with Differential/Platelet    T4, free    DHEA-sulfate    Testosterone    Thyroid Peroxidase Antibody    IBC + Ferritin    TSH    Hepatic function panel    Hemoglobin A1c    Lipid panel    BASIC METABOLIC PANEL WITH GFR    CBC with Differential/Platelet    T4, free    DHEA-sulfate    Testosterone    Thyroid Peroxidase Antibody  7. Thyroiditis, autoimmune  E06.3 TSH    Hepatic function panel    Hemoglobin A1c    Lipid panel    BASIC METABOLIC PANEL WITH GFR    CBC with Differential/Platelet    T4, free    DHEA-sulfate    Testosterone    Thyroid Peroxidase  Antibody    IBC + Ferritin    TSH    Hepatic function panel    Hemoglobin A1c    Lipid panel    BASIC METABOLIC PANEL WITH GFR    CBC with Differential/Platelet    T4, free    DHEA-sulfate    Testosterone    Thyroid Peroxidase Antibody  8. Iron deficiency  E61.1 IBC + Ferritin    Celiac Disease Comprehensive Panel with Reflexes    H. pylori antibody, IgG    Celiac Disease Comprehensive Panel with Reflexes  9. Belching symptom  R14.2 Celiac Disease Comprehensive Panel with Reflexes    H. pylori antibody, IgG    Celiac Disease Comprehensive Panel with Reflexes  Plan lab hormonal levels if have not done consider have her see dermatologist about the hair loss but will readdress depending on her lab test. We can try to go back to the 750 XR twice a day or at least once a day for her PCOS diagnosis her A1c's have been very good. Uncertain if her belching could be related to Metformin but no alarm symptoms otherwise. Check celiac panel as it appears not having been done previously. Return in about 1 year (around 10/15/2021) for depending on results.  Patient Care Team: Rakel Junio, Neta Mends, MD as PCP - General (Internal Medicine) Talmage Coin, MD as Attending Physician (Endocrinology) Patient Instructions  Tried Metformin 750 extended release once or twice a day or take at same time It is possible Metformin can cause stomach symptoms but is not serious.  We will let you know lab results today and rechecking hormone levels such as testosterone DHEA and thyroid. Keep walking exercise as possible.   Health Maintenance, Female Adopting a healthy lifestyle and getting preventive care are important in promoting health and wellness. Ask your health care provider about:  The right schedule for you to have regular tests and exams.  Things you can do on your own to prevent diseases and keep yourself healthy. What should I know about diet, weight, and exercise? Eat a healthy diet   Eat a diet  that includes plenty of vegetables, fruits, low-fat dairy products, and lean protein.  Do not eat a lot of foods that are high in solid fats, added sugars, or sodium. Maintain a healthy weight Body mass index (BMI) is used to identify weight problems. It estimates body fat based on height and weight. Your health care provider can help determine your BMI and help you achieve or maintain a healthy weight. Get regular exercise Get regular exercise. This is  one of the most important things you can do for your health. Most adults should:  Exercise for at least 150 minutes each week. The exercise should increase your heart rate and make you sweat (moderate-intensity exercise).  Do strengthening exercises at least twice a week. This is in addition to the moderate-intensity exercise.  Spend less time sitting. Even light physical activity can be beneficial. Watch cholesterol and blood lipids Have your blood tested for lipids and cholesterol at 29 years of age, then have this test every 5 years. Have your cholesterol levels checked more often if:  Your lipid or cholesterol levels are high.  You are older than 29 years of age.  You are at high risk for heart disease. What should I know about cancer screening? Depending on your health history and family history, you may need to have cancer screening at various ages. This may include screening for:  Breast cancer.  Cervical cancer.  Colorectal cancer.  Skin cancer.  Lung cancer. What should I know about heart disease, diabetes, and high blood pressure? Blood pressure and heart disease  High blood pressure causes heart disease and increases the risk of stroke. This is more likely to develop in people who have high blood pressure readings, are of African descent, or are overweight.  Have your blood pressure checked: ? Every 3-5 years if you are 65-48 years of age. ? Every year if you are 34 years old or older. Diabetes Have regular  diabetes screenings. This checks your fasting blood sugar level. Have the screening done:  Once every three years after age 64 if you are at a normal weight and have a low risk for diabetes.  More often and at a younger age if you are overweight or have a high risk for diabetes. What should I know about preventing infection? Hepatitis B If you have a higher risk for hepatitis B, you should be screened for this virus. Talk with your health care provider to find out if you are at risk for hepatitis B infection. Hepatitis C Testing is recommended for:  Everyone born from [birth year] through 1965.  Anyone with known risk factors for hepatitis C. Sexually transmitted infections (STIs)  Get screened for STIs, including gonorrhea and chlamydia, if: ? You are sexually active and are younger than 29 years of age. ? You are older than 29 years of age and your health care provider tells you that you are at risk for this type of infection. ? Your sexual activity has changed since you were last screened, and you are at increased risk for chlamydia or gonorrhea. Ask your health care provider if you are at risk.  Ask your health care provider about whether you are at high risk for HIV. Your health care provider may recommend a prescription medicine to help prevent HIV infection. If you choose to take medicine to prevent HIV, you should first get tested for HIV. You should then be tested every 3 months for as long as you are taking the medicine. Pregnancy  If you are about to stop having your period (premenopausal) and you may become pregnant, seek counseling before you get pregnant.  Take 400 to 800 micrograms (mcg) of folic acid every day if you become pregnant.  Ask for birth control (contraception) if you want to prevent pregnancy. Osteoporosis and menopause Osteoporosis is a disease in which the bones lose minerals and strength with aging. This can result in bone fractures. If you are 19 years old or  older, or if you are at risk for osteoporosis and fractures, ask your health care provider if you should:  Be screened for bone loss.  Take a calcium or vitamin D supplement to lower your risk of fractures.  Be given hormone replacement therapy (HRT) to treat symptoms of menopause. Follow these instructions at home: Lifestyle  Do not use any products that contain nicotine or tobacco, such as cigarettes, e-cigarettes, and chewing tobacco. If you need help quitting, ask your health care provider.  Do not use street drugs.  Do not share needles.  Ask your health care provider for help if you need support or information about quitting drugs. Alcohol use  Do not drink alcohol if: ? Your health care provider tells you not to drink. ? You are pregnant, may be pregnant, or are planning to become pregnant.  If you drink alcohol: ? Limit how much you use to 0-1 drink a day. ? Limit intake if you are breastfeeding.  Be aware of how much alcohol is in your drink. In the [place] one drink equals one 12 oz bottle of beer (355 mL), one 5 oz glass of wine (148 mL), or one 1 oz glass of hard liquor (44 mL). General instructions  Schedule regular health, dental, and eye exams.  Stay current with your vaccines.  Tell your health care provider if: ? You often feel depressed. ? You have ever been abused or do not feel safe at home. Summary  Adopting a healthy lifestyle and getting preventive care are important in promoting health and wellness.  Follow your health care provider's instructions about healthy diet, exercising, and getting tested or screened for diseases.  Follow your health care provider's instructions on monitoring your cholesterol and blood pressure. This information is not intended to replace advice given to you by your health care provider. Make sure you discuss any questions you have with your health care provider. Document Revised: 10/04/2018 Document Reviewed:  10/04/2018 Elsevier Patient Education  2020 [employer]     Sam RayburnWanda K. Suriah Peragine M.D.

## 2020-10-15 ENCOUNTER — Other Ambulatory Visit: Payer: Self-pay

## 2020-10-15 ENCOUNTER — Encounter: Payer: Self-pay | Admitting: Internal Medicine

## 2020-10-15 ENCOUNTER — Ambulatory Visit (INDEPENDENT_AMBULATORY_CARE_PROVIDER_SITE_OTHER): Payer: BC Managed Care – PPO | Admitting: Internal Medicine

## 2020-10-15 VITALS — BP 118/72 | HR 74 | Temp 98.1°F | Ht 65.0 in | Wt 136.0 lb

## 2020-10-15 DIAGNOSIS — R7303 Prediabetes: Secondary | ICD-10-CM | POA: Diagnosis not present

## 2020-10-15 DIAGNOSIS — Z79899 Other long term (current) drug therapy: Secondary | ICD-10-CM | POA: Diagnosis not present

## 2020-10-15 DIAGNOSIS — E282 Polycystic ovarian syndrome: Secondary | ICD-10-CM | POA: Diagnosis not present

## 2020-10-15 DIAGNOSIS — E063 Autoimmune thyroiditis: Secondary | ICD-10-CM | POA: Diagnosis not present

## 2020-10-15 DIAGNOSIS — R6250 Unspecified lack of expected normal physiological development in childhood: Secondary | ICD-10-CM

## 2020-10-15 DIAGNOSIS — Z0000 Encounter for general adult medical examination without abnormal findings: Secondary | ICD-10-CM | POA: Diagnosis not present

## 2020-10-15 DIAGNOSIS — L659 Nonscarring hair loss, unspecified: Secondary | ICD-10-CM | POA: Diagnosis not present

## 2020-10-15 DIAGNOSIS — R142 Eructation: Secondary | ICD-10-CM

## 2020-10-15 DIAGNOSIS — E611 Iron deficiency: Secondary | ICD-10-CM | POA: Diagnosis not present

## 2020-10-15 MED ORDER — METFORMIN HCL ER 750 MG PO TB24: 750.0000 mg | ORAL_TABLET | Freq: Two times a day (BID) | ORAL | 3 refills | Status: DC

## 2020-10-15 NOTE — Patient Instructions (Addendum)
Tried Metformin 750 extended release once or twice a day or take at same time It is possible Metformin can cause stomach symptoms but is not serious.  We will let you know lab results today and rechecking hormone levels such as testosterone DHEA and thyroid. Keep walking exercise as possible.   Health Maintenance, Female Adopting a healthy lifestyle and getting preventive care are important in promoting health and wellness. Ask your health care provider about:  The right schedule for you to have regular tests and exams.  Things you can do on your own to prevent diseases and keep yourself healthy. What should I know about diet, weight, and exercise? Eat a healthy diet   Eat a diet that includes plenty of vegetables, fruits, low-fat dairy products, and lean protein.  Do not eat a lot of foods that are high in solid fats, added sugars, or sodium. Maintain a healthy weight Body mass index (BMI) is used to identify weight problems. It estimates body fat based on height and weight. Your health care provider can help determine your BMI and help you achieve or maintain a healthy weight. Get regular exercise Get regular exercise. This is one of the most important things you can do for your health. Most adults should:  Exercise for at least 150 minutes each week. The exercise should increase your heart rate and make you sweat (moderate-intensity exercise).  Do strengthening exercises at least twice a week. This is in addition to the moderate-intensity exercise.  Spend less time sitting. Even light physical activity can be beneficial. Watch cholesterol and blood lipids Have your blood tested for lipids and cholesterol at 29 years of age, then have this test every 5 years. Have your cholesterol levels checked more often if:  Your lipid or cholesterol levels are high.  You are older than 29 years of age.  You are at high risk for heart disease. What should I know about cancer  screening? Depending on your health history and family history, you may need to have cancer screening at various ages. This may include screening for:  Breast cancer.  Cervical cancer.  Colorectal cancer.  Skin cancer.  Lung cancer. What should I know about heart disease, diabetes, and high blood pressure? Blood pressure and heart disease  High blood pressure causes heart disease and increases the risk of stroke. This is more likely to develop in people who have high blood pressure readings, are of African descent, or are overweight.  Have your blood pressure checked: ? Every 3-5 years if you are 54-101 years of age. ? Every year if you are 34 years old or older. Diabetes Have regular diabetes screenings. This checks your fasting blood sugar level. Have the screening done:  Once every three years after age 33 if you are at a normal weight and have a low risk for diabetes.  More often and at a younger age if you are overweight or have a high risk for diabetes. What should I know about preventing infection? Hepatitis B If you have a higher risk for hepatitis B, you should be screened for this virus. Talk with your health care provider to find out if you are at risk for hepatitis B infection. Hepatitis C Testing is recommended for:  Everyone born from [birth year] through 1965.  Anyone with known risk factors for hepatitis C. Sexually transmitted infections (STIs)  Get screened for STIs, including gonorrhea and chlamydia, if: ? You are sexually active and are younger than 29 years of age. ?  You are older than 29 years of age and your health care provider tells you that you are at risk for this type of infection. ? Your sexual activity has changed since you were last screened, and you are at increased risk for chlamydia or gonorrhea. Ask your health care provider if you are at risk.  Ask your health care provider about whether you are at high risk for HIV. Your health care provider may  recommend a prescription medicine to help prevent HIV infection. If you choose to take medicine to prevent HIV, you should first get tested for HIV. You should then be tested every 3 months for as long as you are taking the medicine. Pregnancy  If you are about to stop having your period (premenopausal) and you may become pregnant, seek counseling before you get pregnant.  Take 400 to 800 micrograms (mcg) of folic acid every day if you become pregnant.  Ask for birth control (contraception) if you want to prevent pregnancy. Osteoporosis and menopause Osteoporosis is a disease in which the bones lose minerals and strength with aging. This can result in bone fractures. If you are 31 years old or older, or if you are at risk for osteoporosis and fractures, ask your health care provider if you should:  Be screened for bone loss.  Take a calcium or vitamin D supplement to lower your risk of fractures.  Be given hormone replacement therapy (HRT) to treat symptoms of menopause. Follow these instructions at home: Lifestyle  Do not use any products that contain nicotine or tobacco, such as cigarettes, e-cigarettes, and chewing tobacco. If you need help quitting, ask your health care provider.  Do not use street drugs.  Do not share needles.  Ask your health care provider for help if you need support or information about quitting drugs. Alcohol use  Do not drink alcohol if: ? Your health care provider tells you not to drink. ? You are pregnant, may be pregnant, or are planning to become pregnant.  If you drink alcohol: ? Limit how much you use to 0-1 drink a day. ? Limit intake if you are breastfeeding.  Be aware of how much alcohol is in your drink. In the [place] one drink equals one 12 oz bottle of beer (355 mL), one 5 oz glass of wine (148 mL), or one 1 oz glass of hard liquor (44 mL). General instructions  Schedule regular health, dental, and eye exams.  Stay current with your  vaccines.  Tell your health care provider if: ? You often feel depressed. ? You have ever been abused or do not feel safe at home. Summary  Adopting a healthy lifestyle and getting preventive care are important in promoting health and wellness.  Follow your health care provider's instructions about healthy diet, exercising, and getting tested or screened for diseases.  Follow your health care provider's instructions on monitoring your cholesterol and blood pressure. This information is not intended to replace advice given to you by your health care provider. Make sure you discuss any questions you have with your health care provider. Document Revised: 10/04/2018 Document Reviewed: 10/04/2018 Elsevier Patient Education  2020 [employer].

## 2020-10-16 LAB — BASIC METABOLIC PANEL WITH GFR
BUN: 7 mg/dL (ref 7–25)
CO2: 26 mmol/L (ref 20–32)
Calcium: 9.7 mg/dL (ref 8.6–10.2)
Chloride: 100 mmol/L (ref 98–110)
Creat: 0.61 mg/dL (ref 0.50–1.10)
GFR, Est African American: 142 mL/min/{1.73_m2} (ref >=60)
GFR, Est Non African American: 123 mL/min/{1.73_m2} (ref >=60)
Glucose, Bld: 81 mg/dL (ref 65–99)
Potassium: 4.2 mmol/L (ref 3.5–5.3)
Sodium: 135 mmol/L (ref 135–146)

## 2020-10-16 LAB — CBC WITH DIFFERENTIAL/PLATELET
Absolute Monocytes: 618 cells/uL (ref 200–950)
Basophils Absolute: 41 cells/uL (ref 0–200)
Basophils Relative: 0.4 %
Eosinophils Absolute: 103 cells/uL (ref 15–500)
Eosinophils Relative: 1 %
HCT: 40.9 % (ref 35.0–45.0)
Hemoglobin: 14.4 g/dL (ref 11.7–15.5)
Lymphs Abs: 3646 cells/uL (ref 850–3900)
MCH: 29.4 pg (ref 27.0–33.0)
MCHC: 35.2 g/dL (ref 32.0–36.0)
MCV: 83.5 fL (ref 80.0–100.0)
MPV: 12.4 fL (ref 7.5–12.5)
Monocytes Relative: 6 %
Neutro Abs: 5892 cells/uL (ref 1500–7800)
Neutrophils Relative %: 57.2 %
Platelets: 325 10*3/uL (ref 140–400)
RBC: 4.9 10*6/uL (ref 3.80–5.10)
RDW: 14.1 % (ref 11.0–15.0)
Total Lymphocyte: 35.4 %
WBC: 10.3 10*3/uL (ref 3.8–10.8)

## 2020-10-16 LAB — DHEA-SULFATE: DHEA-SO4: 134 ug/dL (ref 18–391)

## 2020-10-16 LAB — LIPID PANEL
Cholesterol: 157 mg/dL (ref <200)
HDL: 61 mg/dL (ref >=50)
LDL Cholesterol (Calc): 76 mg/dL (calc)
Non-HDL Cholesterol (Calc): 96 mg/dL (calc) (ref <130)
Total CHOL/HDL Ratio: 2.6 (calc) (ref <5.0)
Triglycerides: 115 mg/dL (ref <150)

## 2020-10-16 LAB — THYROID PEROXIDASE ANTIBODY: Thyroperoxidase Ab SerPl-aCnc: 2 IU/mL (ref <9)

## 2020-10-16 LAB — T4, FREE: Free T4: 1.3 ng/dL (ref 0.8–1.8)

## 2020-10-16 LAB — HEPATIC FUNCTION PANEL
AG Ratio: 1.2 (calc) (ref 1.0–2.5)
ALT: 13 U/L (ref 6–29)
AST: 14 U/L (ref 10–30)
Albumin: 4.6 g/dL (ref 3.6–5.1)
Alkaline phosphatase (APISO): 70 U/L (ref 31–125)
Bilirubin, Direct: 0.2 mg/dL (ref 0.0–0.2)
Globulin: 3.9 g/dL (calc) — ABNORMAL HIGH (ref 1.9–3.7)
Indirect Bilirubin: 0.5 mg/dL (calc) (ref 0.2–1.2)
Total Bilirubin: 0.7 mg/dL (ref 0.2–1.2)
Total Protein: 8.5 g/dL — ABNORMAL HIGH (ref 6.1–8.1)

## 2020-10-16 LAB — CELIAC DISEASE COMPREHENSIVE PANEL WITH REFLEXES
(tTG) Ab, IgA: <1 U/mL
Immunoglobulin A: 938 mg/dL — ABNORMAL HIGH (ref 47–310)

## 2020-10-16 LAB — HEMOGLOBIN A1C
Hgb A1c MFr Bld: 4.3 % of total Hgb (ref <5.7)
Mean Plasma Glucose: 77 mg/dL
eAG (mmol/L): 4.2 mmol/L

## 2020-10-16 LAB — TESTOSTERONE, TOTAL, LC/MS/MS

## 2020-10-16 LAB — TSH: TSH: 2.36 mIU/L

## 2020-10-21 NOTE — Progress Notes (Signed)
Blood work results acceptable or normal except for elevated IgA which is nonspecific no evidence of celiac disease waiting for testosterone level result has not come back Thyroid is now normal.  Please check with lab about testosterone level.  Result

## 2020-10-23 NOTE — Progress Notes (Signed)
Patient aware of results and referral and orders placed.

## 2020-10-23 NOTE — Progress Notes (Signed)
Note sent to lab to follow up on testosterone.

## 2020-10-29 ENCOUNTER — Other Ambulatory Visit: Payer: Self-pay | Admitting: Internal Medicine

## 2020-10-29 DIAGNOSIS — L659 Nonscarring hair loss, unspecified: Secondary | ICD-10-CM

## 2020-10-29 DIAGNOSIS — E282 Polycystic ovarian syndrome: Secondary | ICD-10-CM

## 2020-10-29 NOTE — Progress Notes (Signed)
This encounter was created in error - please disregard.

## 2020-10-29 NOTE — Progress Notes (Signed)
Created future order for Testosterone/this was originally ordered incorrectly and the [facility] tech was unable to see the order/Lisa Basilia Jumbo has made multiple attempts to reach out to the patient/when pt calls office we will schedule a lab visit to draw lab/thx dmf

## 2021-02-20 ENCOUNTER — Ambulatory Visit: Payer: BC Managed Care – PPO | Admitting: Gastroenterology

## 2021-03-17 ENCOUNTER — Other Ambulatory Visit: Payer: BC Managed Care – PPO

## 2021-03-17 ENCOUNTER — Encounter: Payer: Self-pay | Admitting: Gastroenterology

## 2021-03-17 ENCOUNTER — Ambulatory Visit (INDEPENDENT_AMBULATORY_CARE_PROVIDER_SITE_OTHER): Payer: BC Managed Care – PPO | Admitting: Gastroenterology

## 2021-03-17 VITALS — BP 100/64 | HR 89 | Ht 63.0 in | Wt 132.0 lb

## 2021-03-17 DIAGNOSIS — K219 Gastro-esophageal reflux disease without esophagitis: Secondary | ICD-10-CM | POA: Diagnosis not present

## 2021-03-17 DIAGNOSIS — R142 Eructation: Secondary | ICD-10-CM

## 2021-03-17 MED ORDER — PANTOPRAZOLE SODIUM 40 MG PO TBEC: 40.0000 mg | DELAYED_RELEASE_TABLET | Freq: Every morning | ORAL | 3 refills | Status: DC

## 2021-03-17 NOTE — Patient Instructions (Addendum)
It was a pleasure to meet you today. Based on our discussion, I am providing you with my recommendations below:  RECOMMENDATION(S):   PROCEDURE:  . I am recommending that you have a(n) Endoscopy completed. Per your request, my staff did not schedule the procedure(s) today. When you are ready to proceed with scheduling, please contact my office at [phone].   NOTE:   . At the time of scheduling your procedure, you will also be scheduled for a pre-visit with my nurse. During this appointment, you will be provided with your prep instructions.  PRESCRIPTION MEDICATION(S):   We have sent the following medication(s) to your pharmacy:  . Pantoprazole  NOTE: If your medication(s) requires a PRIOR AUTHORIZATION, we will receive notification from your pharmacy. Once received, the process to submit for approval may take up to 7-10 business days. You will be contacted about any denials we have received from your insurance company as well as alternatives recommended by your provider.  LABS:   . Please proceed to the basement level for lab work before leaving today. Press "B" on the elevator. The lab is located at the first door on the left as you exit the elevator.  HEALTHCARE LAWS AND MY CHART RESULTS:   . Due to recent changes in healthcare laws, you may see results of your imaging and/or laboratory studies on MyChart before I have had a chance to review them.  I understand that in some cases there may be results that are confusing or concerning to you. Please understand that not all results are received at same time and often I may need to interpret multiple results in order to provide you with the best plan of care or course of treatment. Therefore, I ask that you please give me 48 hours to thoroughly review all your results before contacting my office for clarification.   BMI:  . If you are age 30 or younger, your body mass index should be between 19-25. Your There is no height or weight on  file to calculate BMI. If this is out of the aformentioned range listed, please consider follow up with your Primary Care Provider.   MY CHART:  The [facility] providers would like to encourage you to use [facility] to communicate with providers for non-urgent requests or questions.  Due to long hold times on the telephone, sending your provider a message by [facility] may be a faster and more efficient way to get a response.  Please allow 48 business hours for a response.  Please remember that this is for non-urgent requests.   Thank you for trusting me with your gastrointestinal care!    Tressia Danas, MD, MPH

## 2021-03-17 NOTE — Progress Notes (Signed)
Referring Provider: Madelin Headings, MD Primary Care Physician:  Madelin Headings, MD  Reason for Consultation:  Eructation   IMPRESSION:  Eructation GERD  PLAN: - Continue Align - Do not use chewing gum, carbonated beverages and whipped desserts - After eating, take walk, stay upright, and keep moving - Some people find papaya and pineapple can help - Whatever you eat, chew foods slowly, avoid washing meals down with liquids, and try to eat smaller servings.  - Swallowing a lot of air (aerophagia) can be related to stress or anxiety, and treating these underlying issues may help calm your digestive tract. Please talk to your primary care doctor if you think this might be helpful. - Trial of pantoprazole 40 mg QAM - H pylori stool antigen - EGD with biopsies (does not want to study at this time)   HPI: Elizabeth Cantrell is a 30 y.o. female referred for eructation. She has congenital developmental delay disorder, prediabetes on metformin, polycystic ovarian syndrome, autoimmune thyroiditis, and a history of iron deficiency. The history is obtained through the patient and her mother who accompanies her to this appointment.   Longstanding history of digestive problems since birth.  Many years of eructation, worse over the last 2 years with nausea, some vomiting, and unintentional weight gain.  Recent vomiting related to high-dose metformin. Celiac panel was negative. Also having trouble with hair loss.  Align provided some relief with improvement in GERD.    Labs 10/15/20 showed a normal CMP, TSH,   IgG is elevated at 938. TTGA negative.   Abdominal ultrasound in 2018 for abdominal pain, nausea, and vomiting was normal  No known family history of colon cancer or polyps. No family history of uterine/endometrial cancer, pancreatic cancer or gastric/stomach cancer.   Past Medical History:  Diagnosis Date  . Acne   . Acquired acanthosis nigricans   . Amenorrhea    until beginning  glucophage  . ANA positive   . 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 Testing for Rett Syndrome normal.  . Fractured elbow    x 2 with falls  . Heavy periods   . Hirsutism    with elevated free testosterone levels  . Hyperinsulinism   . Hypoglycemia    with seizures that seem to respond to glucose  . Nosebleed    with abnormal clotting studies  . Seizure disorder (HCC)   . Seizures (HCC)    hx of  . Striae   . Tachycardia   . 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 No pertinent surgical history.  Current Outpatient Medications  Medication Sig Dispense Refill  . Biotin 1000 MCG tablet Take 1,000 mcg by mouth daily.    . cyanocobalamin 100 MCG tablet Take 100 mcg by mouth daily.    . metFORMIN (GLUCOPHAGE-XR) 750 MG 24 hr tablet Take 1 tablet (750 mg total) by mouth 2 (two) times daily. 180 tablet 3  . Probiotic Product (ALIGN PO) Take 1 capsule by mouth daily.     No current facility-administered medications for this visit.    Allergies as of 03/17/2021  . (No Known Allergies)    Family History  Problem Relation Age of Onset  . Diabetes Mother   . Hypertension Mother   . Hyperlipidemia Other        parent grandparent    Social History   Socioeconomic  History  . Marital status: Single    Spouse name: Not on file  . Number of children: Not on file  . Years of education: 67  . Highest education level: Not on file  Occupational History  . Not on file  Tobacco Use  . Smoking status: Never Smoker  . Smokeless tobacco: Never Used  Vaping Use  . Vaping Use: Never used  Substance and Sexual Activity  . Alcohol use: No  . Drug use: No  . Sexual activity: Not on file  Other Topics Concern  . Not on file  Social History Narrative   hh of  3   [employer]  Elizabeth Cantrell gets    Neg tad   6 hours sleep gets speech therapy at  school ( no ot pt)   Patient lives at home with her parents.   Caffeine Use: none   No pets    Social Determinants of Corporate investment banker Strain: Not on file  Food Insecurity: Not on file  Transportation Needs: Not on file  Physical Activity: Not on file  Stress: Not on file  Social Connections: Not on file  Intimate Partner Violence: Not on file    Review of Systems: 12 system ROS is negative except as noted above.   Physical Exam: General:   Alert,  well-nourished, pleasant and cooperative in NAD Head:  Normocephalic and atraumatic. Eyes:  Sclera clear, no icterus.   Conjunctiva pink. Ears:  Normal auditory acuity. Nose:  No deformity, discharge,  or lesions. Mouth:  No deformity or lesions.   Neck:  Supple; no masses or thyromegaly. Lungs:  Clear throughout to auscultation.   No wheezes. Heart:  Regular rate and rhythm; no murmurs. Abdomen:  Soft,nontender, nondistended, normal bowel sounds, no rebound or guarding. No hepatosplenomegaly.   Rectal:  Deferred  Msk:  Symmetrical. No boney deformities LAD: No inguinal or umbilical LAD Extremities:  No clubbing or edema. Neurologic:  Alert and  oriented x4;  grossly nonfocal Skin:  Intact without significant lesions or rashes. Psych:  Alert and cooperative. Normal mood and affect.    Elizabeth Cantrell L. Orvan Falconer, MD, MPH 03/17/2021, 11:22 AM

## 2021-06-22 ENCOUNTER — Encounter: Payer: Self-pay | Admitting: Internal Medicine

## 2021-07-15 ENCOUNTER — Ambulatory Visit: Payer: BC Managed Care – PPO | Admitting: Internal Medicine

## 2021-08-12 ENCOUNTER — Encounter: Payer: Self-pay | Admitting: Internal Medicine

## 2021-08-12 ENCOUNTER — Other Ambulatory Visit: Payer: Self-pay

## 2021-08-12 ENCOUNTER — Other Ambulatory Visit: Payer: BC Managed Care – PPO

## 2021-08-12 ENCOUNTER — Ambulatory Visit (INDEPENDENT_AMBULATORY_CARE_PROVIDER_SITE_OTHER): Payer: BC Managed Care – PPO | Admitting: Internal Medicine

## 2021-08-12 VITALS — BP 106/70 | HR 76 | Temp 98.5°F | Ht 63.0 in | Wt 131.6 lb

## 2021-08-12 DIAGNOSIS — R198 Other specified symptoms and signs involving the digestive system and abdomen: Secondary | ICD-10-CM

## 2021-08-12 DIAGNOSIS — E282 Polycystic ovarian syndrome: Secondary | ICD-10-CM | POA: Diagnosis not present

## 2021-08-12 DIAGNOSIS — E063 Autoimmune thyroiditis: Secondary | ICD-10-CM | POA: Diagnosis not present

## 2021-08-12 DIAGNOSIS — L659 Nonscarring hair loss, unspecified: Secondary | ICD-10-CM | POA: Diagnosis not present

## 2021-08-12 DIAGNOSIS — E611 Iron deficiency: Secondary | ICD-10-CM | POA: Diagnosis not present

## 2021-08-12 DIAGNOSIS — R7303 Prediabetes: Secondary | ICD-10-CM | POA: Diagnosis not present

## 2021-08-12 DIAGNOSIS — N920 Excessive and frequent menstruation with regular cycle: Secondary | ICD-10-CM

## 2021-08-12 DIAGNOSIS — Z79899 Other long term (current) drug therapy: Secondary | ICD-10-CM | POA: Diagnosis not present

## 2021-08-12 DIAGNOSIS — F79 Unspecified intellectual disabilities: Secondary | ICD-10-CM

## 2021-08-12 DIAGNOSIS — D5912 Cold autoimmune hemolytic anemia: Secondary | ICD-10-CM

## 2021-08-12 LAB — BASIC METABOLIC PANEL
BUN: 9 mg/dL (ref 6–23)
CO2: 26 mEq/L (ref 19–32)
Calcium: 9.5 mg/dL (ref 8.4–10.5)
Chloride: 102 mEq/L (ref 96–112)
Creatinine, Ser: 0.58 mg/dL (ref 0.40–1.20)
GFR: 121.36 mL/min (ref >60.00)
Glucose, Bld: 86 mg/dL (ref 70–99)
Potassium: 3.9 mEq/L (ref 3.5–5.1)
Sodium: 136 mEq/L (ref 135–145)

## 2021-08-12 LAB — CBC WITH DIFFERENTIAL/PLATELET
Absolute Monocytes: 515 cells/uL (ref 200–950)
Basophils Absolute: 28 cells/uL (ref 0–200)
Basophils Relative: 0.3 %
Eosinophils Absolute: 120 cells/uL (ref 15–500)
Eosinophils Relative: 1.3 %
HCT: 41 % (ref 35.0–45.0)
Hemoglobin: 13.9 g/dL (ref 11.7–15.5)
Lymphs Abs: 2714 cells/uL (ref 850–3900)
MCH: 28.3 pg (ref 27.0–33.0)
MCHC: 33.9 g/dL (ref 32.0–36.0)
MCV: 83.5 fL (ref 80.0–100.0)
MPV: 11.9 fL (ref 7.5–12.5)
Monocytes Relative: 5.6 %
Neutro Abs: 5824 cells/uL (ref 1500–7800)
Neutrophils Relative %: 63.3 %
Platelets: 298 10*3/uL (ref 140–400)
RBC: 4.91 10*6/uL (ref 3.80–5.10)
RDW: 15.3 % — ABNORMAL HIGH (ref 11.0–15.0)
Total Lymphocyte: 29.5 %
WBC: 9.2 10*3/uL (ref 3.8–10.8)

## 2021-08-12 LAB — VITAMIN B12: Vitamin B-12: 697 pg/mL (ref 211–911)

## 2021-08-12 LAB — T4, FREE: Free T4: 0.91 ng/dL (ref 0.60–1.60)

## 2021-08-12 LAB — TESTOSTERONE: Testosterone: 39.48 ng/dL (ref 15.00–40.00)

## 2021-08-12 LAB — HEPATIC FUNCTION PANEL
ALT: 11 U/L (ref 0–35)
AST: 12 U/L (ref 0–37)
Albumin: 4.5 g/dL (ref 3.5–5.2)
Alkaline Phosphatase: 63 U/L (ref 39–117)
Bilirubin, Direct: 0.1 mg/dL (ref 0.0–0.3)
Total Bilirubin: 0.7 mg/dL (ref 0.2–1.2)
Total Protein: 8.5 g/dL — ABNORMAL HIGH (ref 6.0–8.3)

## 2021-08-12 LAB — LIPID PANEL
Cholesterol: 147 mg/dL (ref 0–200)
HDL: 49.8 mg/dL (ref >39.00)
LDL Cholesterol: 71 mg/dL (ref 0–99)
NonHDL: 97.3
Total CHOL/HDL Ratio: 3
Triglycerides: 133 mg/dL (ref 0.0–149.0)
VLDL: 26.6 mg/dL (ref 0.0–40.0)

## 2021-08-12 LAB — IBC PANEL
Iron: 59 ug/dL (ref 42–145)
Saturation Ratios: 16.1 % — ABNORMAL LOW (ref 20.0–50.0)
TIBC: 365.4 ug/dL (ref 250.0–450.0)
Transferrin: 261 mg/dL (ref 212.0–360.0)

## 2021-08-12 LAB — TSH: TSH: 1.49 u[IU]/mL (ref 0.35–5.50)

## 2021-08-12 LAB — HEMOGLOBIN A1C: Hgb A1c MFr Bld: 4.1 % — ABNORMAL LOW (ref 4.6–6.5)

## 2021-08-12 MED ORDER — ALIGN PO CAPS: 1.0000 | ORAL_CAPSULE | Freq: Every day | ORAL | 5 refills | Status: AC

## 2021-08-12 NOTE — Progress Notes (Signed)
Chief Complaint  Patient presents with   lab     HPI: Patient  Elizabeth Cantrell  30 y.o. comes in today for  lab monitoring year with her mom  During  covid become more isolated sleeps a lot not a lot to do Digestion is a bit worse taking metformin at least once a day.  Align is the only probiotic that helps her stomach but it is quite expensive.  Asks if a prescription can be written still tiired  sleeping a lot   GI symptoms are somewhat decreased appetite very loud gurgles and nausea  She has been on metformin because of high insulin levels. Uncertain if ever followed through on the hair thinning. Periods are monthly last 7 days and are fairly heavy.  Mom wonders if an ablation would be good for her she is virginal. 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 Testing for Rett Syndrome normal.   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 No pertinent surgical history.  Family History  Problem Relation Age of Onset   Diabetes  Mother    Hypertension Mother    Hyperlipidemia Other        parent grandparent    Social History   Socioeconomic History   Marital status: Single    Spouse name: Not on file   Number of children: Not on file   Years of education: 12   Highest education level: Not on file  Occupational History   Not on file  Tobacco Use   Smoking status: Never   Smokeless tobacco: Never  Vaping Use   Vaping Use: Never used  Substance and Sexual Activity   Alcohol use: No   Drug use: No   Sexual activity: Not on file  Other Topics Concern   Not on file  Social History Narrative   hh of  3   [employer]  [employer] gets    Neg tad   6 hours sleep gets speech therapy at school ( no ot pt)   Patient lives at home with her parents.   Caffeine Use: none   No pets    Social Determinants of Corporate investment banker Strain: Not on file  Food Insecurity: Not on file  Transportation Needs: Not on file  Physical Activity: Not on file  Stress: Not on file  Social Connections: Not on file    Outpatient Medications Prior to Visit  Medication Sig Dispense Refill   Biotin 1000 MCG tablet Take 1,000 mcg by mouth daily.     cyanocobalamin 100 MCG tablet Take 100 mcg by mouth daily.     metFORMIN (GLUCOPHAGE-XR) 750 MG 24 hr tablet Take 1 tablet (750 mg total) by mouth 2 (two) times daily. 180 tablet 3   pantoprazole (PROTONIX) 40 MG tablet Take 1 tablet (40 mg total) by mouth every morning. 90 tablet 3   Probiotic Product (ALIGN PO) Take 1 capsule by mouth daily.     No facility-administered medications prior to visit.     EXAM:  BP 106/70 (BP Location: Left Arm, Patient Position: Sitting, Cuff Size: Normal)   Pulse 76   Temp 98.5 F (36.9 C) (Oral)   Ht 5\' 3"  (1.6 m)   Wt 131 lb 9.6 oz (59.7 kg)   SpO2 99%   BMI 23.31 kg/m   Body mass index is 23.31 kg/m. Wt Readings from Last 3 Encounters:  08/12/21 131 lb 9.6 oz (59.7 kg)  03/17/21 132 lb (59.9 kg)  10/15/20  136 lb (61.7 kg)    Physical Exam: Vital signs reviewed 10/17/20 is a well-developed well-nourished alert cooperative    who appearsr stated age in no acute distress.  HEENT: Microcephalic atraumatic , Eyes: PERRL EOM's full, conjunctiva clear, Nares: paten,t no deformity discharge or tenderness., Ears: no deformity EAC's clear TMs with normal landmarks. Mouth: Masked NECK: supple without masses, thyromegaly or bruits. CHEST/PULM:  Clear to auscultation and percussion breath sounds equal no wheeze , rales or rhonchi. No chest wall deformities or tenderness. Breast: normal by inspection . No dimpling, discharge, masses, tenderness or discharge . CV: PMI is nondisplaced, S1 S2 no gallops, murmurs, rubs. Peripheral pulses are full without delay.No JVD .  ABDOMEN: Bowel sounds normal nontender  No guard or rebound, no hepato splenomegal no CVA tenderness.   Extremtities:  No clubbing cyanosis or edema, no acute joint swelling or redness no focal atrophy somewhat flat feet healing blister right medial toe NEURO:  Oriented x3, cranial nerves 3-12 appear to be intact, gait flat-footed SKIN: No acute rashes normal turgor, color, no bruising or petechiae no significant acne hair thinning throughout. LN: no cervical axillary inguinal adenopathy   Lab Results  Component Value Date   WBC 10.3 10/15/2020   HGB 14.4 10/15/2020   HCT 40.9 10/15/2020   PLT 325 10/15/2020   GLUCOSE 86 08/12/2021   CHOL 147 08/12/2021   TRIG 133.0 08/12/2021   HDL 49.80 08/12/2021   LDLDIRECT 66.8 05/30/2013   LDLCALC 71 08/12/2021   ALT 11 08/12/2021   AST 12 08/12/2021   NA 136 08/12/2021   K 3.9 08/12/2021   CL 102 08/12/2021   CREATININE 0.58 08/12/2021   BUN 9 08/12/2021   CO2 26 08/12/2021   TSH 1.49 08/12/2021   HGBA1C 4.1 (L) 08/12/2021    BP Readings from Last 3 Encounters:  08/12/21 106/70  03/17/21 100/64  10/15/20 118/72    Lab results reviewed with patient   ASSESSMENT AND  PLAN:  Discussed the following assessment and plan:    ICD-10-CM   1. GI symptoms  R19.8 Basic metabolic panel    CBC with Differential/Platelet    Hemoglobin A1c    Hepatic function panel    Lipid panel  TSH    T4, free    Vitamin B12    IBC Panel([facility])    Testosterone    Testosterone    IBC Panel([facility])    Vitamin B12    T4, free    TSH    Lipid panel    Hepatic function panel    Hemoglobin A1c    CBC with Differential/Platelet    Basic metabolic panel    2. PCOS (polycystic ovarian syndrome)  E28.2 Basic metabolic panel    CBC with Differential/Platelet    Hemoglobin A1c    Hepatic function panel    Lipid panel    TSH    T4, free    Vitamin B12    IBC Panel([facility])    Testosterone    Testosterone    IBC Panel([facility])    Vitamin B12    T4, free    TSH    Lipid panel    Hepatic function panel    Hemoglobin A1c    CBC with Differential/Platelet    Basic metabolic panel    3. Medication management  Z79.899 Basic metabolic panel    CBC with Differential/Platelet    Hemoglobin A1c    Hepatic function panel    Lipid panel    TSH    T4, free    Vitamin B12    IBC Panel([facility])    Testosterone    Testosterone    IBC Panel([facility])    Vitamin B12    T4, free    TSH    Lipid panel    Hepatic function panel    Hemoglobin A1c    CBC with Differential/Platelet    Basic metabolic panel    4. Prediabetes  R73.03 Basic metabolic panel    CBC with Differential/Platelet    Hemoglobin A1c    Hepatic function panel    Lipid panel    TSH    T4, free    Vitamin B12    IBC Panel([facility])    Testosterone    Testosterone    IBC Panel([facility])    Vitamin B12    T4, free    TSH    Lipid panel    Hepatic function panel    Hemoglobin A1c    CBC with Differential/Platelet    Basic metabolic panel    5. Iron deficiency  E61.1 Basic metabolic panel    CBC with Differential/Platelet    Hemoglobin A1c    Hepatic function panel    Lipid panel     TSH    T4, free    Vitamin B12    IBC Panel([facility])    Testosterone    Testosterone    IBC Panel([facility])    Vitamin B12    T4, free    TSH    Lipid panel    Hepatic function panel    Hemoglobin A1c    CBC with Differential/Platelet    Basic metabolic panel    6. Thyroiditis, autoimmune  E06.3 Basic metabolic panel    CBC with Differential/Platelet    Hemoglobin A1c    Hepatic function panel    Lipid panel    TSH    T4, free    Vitamin B12    IBC Panel([facility])    Testosterone    Testosterone    IBC Panel([facility])    Vitamin B12    T4, free    TSH    Lipid panel    Hepatic function panel    Hemoglobin A1c    CBC  with Differential/Platelet    Basic metabolic panel    7. Hair thinning  L65.9 Basic metabolic panel    CBC with Differential/Platelet    Hemoglobin A1c    Hepatic function panel    Lipid panel    TSH    T4, free    Vitamin B12    IBC Panel([facility])    Testosterone    Testosterone    IBC Panel([facility])    Vitamin B12    T4, free    TSH    Lipid panel    Hepatic function panel    Hemoglobin A1c    CBC with Differential/Platelet    Basic metabolic panel    8. Intellectual disability  F79     9. Menorrhagia with regular cycle  N92.0       Continued significant GI symptoms   stop the metformin for about 2 weeks to see if helps stomach she is on this for hyperinsulinemia PCOS. See note Dr Orvan Falconer   never got the  h pylori  and declined endoscopies . From May . Get GYN consult have mom let us know if we need to do an official referral. Plan labs today go from there can follow-up in 3 months. Prescription written for a line Concerned about a version of depression and isolation from the pandemic discussed getting out with activities family etc. Return for depending on results and how doing  2-3 mos.  Patient Care Team: Jillana Selph, Neta Mends, MD as PCP - General (Internal Medicine) Talmage Coin, MD as Attending Physician (Endocrinology) Patient  Instructions  Try stopping the metformin for at least 2 weeks and see how stomach is doing.   Can take MVI and will see  if need to add more vitamins. Will rx align but not sure if  covered by insurance .   Concern about mood  need some help getting out . Consider  counseling   If  needed we can get more opinion.  See gyne consult about the heavy periods  as we discussed.   Plan fu depending    Can you send in message after stopping the metformin to see how doing?  Neta Mends. Amato Sevillano M.D.

## 2021-08-12 NOTE — Patient Instructions (Addendum)
Try stopping the metformin for at least 2 weeks and see how stomach is doing.   Can take MVI and will see  if need to add more vitamins. Will rx align but not sure if  covered by insurance .   Concern about mood  need some help getting out . Consider  counseling   If  needed we can get more opinion.  See gyne consult about the heavy periods  as we discussed.   Plan fu depending    Can you send in message after stopping the metformin to see how doing?

## 2021-08-12 NOTE — Addendum Note (Signed)
Addended by: Marian Sorrow D on: 08/12/2021 03:08 PM   Modules accepted: Orders

## 2021-08-14 NOTE — Progress Notes (Signed)
Blood results are good  or stable except  iron level slightly low  but no anemia .  Proceed with plan and if can take iron supplement every other day  or on days of period bleeding  ( OTC   )

## 2021-10-20 ENCOUNTER — Ambulatory Visit (INDEPENDENT_AMBULATORY_CARE_PROVIDER_SITE_OTHER): Payer: BC Managed Care – PPO | Admitting: Internal Medicine

## 2021-10-20 ENCOUNTER — Encounter: Payer: Self-pay | Admitting: Internal Medicine

## 2021-10-20 VITALS — BP 100/70 | HR 77 | Temp 97.8°F | Ht 63.0 in | Wt 130.2 lb

## 2021-10-20 DIAGNOSIS — Z0000 Encounter for general adult medical examination without abnormal findings: Secondary | ICD-10-CM

## 2021-10-20 DIAGNOSIS — E063 Autoimmune thyroiditis: Secondary | ICD-10-CM

## 2021-10-20 DIAGNOSIS — Z79899 Other long term (current) drug therapy: Secondary | ICD-10-CM | POA: Diagnosis not present

## 2021-10-20 DIAGNOSIS — E161 Other hypoglycemia: Secondary | ICD-10-CM | POA: Diagnosis not present

## 2021-10-20 DIAGNOSIS — E611 Iron deficiency: Secondary | ICD-10-CM | POA: Diagnosis not present

## 2021-10-20 DIAGNOSIS — E282 Polycystic ovarian syndrome: Secondary | ICD-10-CM | POA: Diagnosis not present

## 2021-10-20 DIAGNOSIS — R6250 Unspecified lack of expected normal physiological development in childhood: Secondary | ICD-10-CM | POA: Diagnosis not present

## 2021-10-20 MED ORDER — METFORMIN HCL ER 750 MG PO TB24: 750.0000 mg | ORAL_TABLET | Freq: Every day | ORAL | 3 refills | Status: DC

## 2021-10-20 NOTE — Progress Notes (Signed)
Chief Complaint  Patient presents with   Annual Exam    HPI: Patient  Elizabeth Cantrell  30 y.o. comes in today for Preventive Health Care visit here with mom.  Gi sx   trial off metformin   doing some better .  Eating normal now.  Still gets tired but no chest pain shortness of breath noted.  No coughing is now taking 1 a day 750 and tolerating. Tends to sleep in  No excess bleeding except for periods tend to be very heavy.  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 Testing for Rett Syndrome normal.   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  Family History  Problem Relation Age of Onset   Diabetes Mother    Hypertension Mother    Hyperlipidemia Other        parent grandparent    Social History   Socioeconomic History   Marital status: Single    Spouse name: Not on file   Number of children: Not on file   Years of education: 12   Highest education level:  Not on file  Occupational History   Not on file  Tobacco Use   Smoking status: Never   Smokeless tobacco: Never  Vaping Use   Vaping Use: Never used  Substance and Sexual Activity   Alcohol use: No   Drug use: No   Sexual activity: Not on file  Other Topics Concern   Not on file  Social History Narrative   hh of  3   [employer]  [employer] [place]    Neg tad   6 hours sleep gets speech therapy at school ( no ot pt)   Patient lives at home with her parents.   Caffeine Use: none   No pets    Social Determinants of Corporate investment banker Strain: Not on file  Food Insecurity: Not on file  Transportation Needs: Not on file  Physical Activity: Not on file  Stress: Not on file  Social Connections: Not on file    Outpatient Medications Prior to Visit  Medication Sig Dispense Refill   Probiotic Product (ALIGN PO) Take 1 capsule by mouth daily.  metFORMIN (GLUCOPHAGE-XR) 750 MG 24 hr tablet Take 1 tablet (750 mg total) by mouth 2 (two) times daily. 180 tablet 3   bifidobacterium infantis (ALIGN) capsule Take 1 capsule by mouth daily. 30 capsule 5   Biotin 1000 MCG tablet Take 1,000 mcg by mouth daily.     cyanocobalamin 100 MCG tablet Take 100 mcg by mouth daily.     pantoprazole (PROTONIX) 40 MG tablet Take 1 tablet (40 mg total) by mouth every morning. 90 tablet 3   No facility-administered medications prior to visit.     EXAM:  BP 100/70 (BP Location: Left Arm, Patient Position: Sitting, Cuff Size: Normal)    Pulse 77    Temp 97.8 F (36.6 C) (Oral)    Ht 5\' 3"  (1.6 m)    Wt 130 lb 3.2 oz (59.1 kg)    SpO2 98%    BMI 23.06 kg/m   Body mass index is 23.06 kg/m. Wt Readings from Last 3 Encounters:  10/20/21 130 lb 3.2 oz (59.1 kg)  08/12/21 131 lb 9.6 oz (59.7 kg)  03/17/21 132 lb (59.9 kg)    Physical Exam: Vital signs reviewed in room 03/19/21 is a well-nourished alert cooperative    who appearsr stated age in no acute distress.  HEENT:  normocephalic atraumatic , Eyes: PERRL EOM's full, conjunctiva clear, Nares: paten,t no deformity discharge or tenderness., Ears: no deformity EAC's 1 + soft wax  TMs with normal landmarks. Mouth: Masked NECK: supple without masses, thyromegaly or bruits. CHEST/PULM:  Clear to auscultation and percussion breath sounds equal no wheeze , rales or rhonchi. No chest wall deformities or tenderness. Breast: normal by inspection . No dimpling, discharge, masses, tenderness or discharge . CV: PMI is nondisplaced, S1 S2 no gallops, murmurs, rubs. Peripheral pulses are full without delay.No JVD .  ABDOMEN: Bowel sounds normal nontender  No guard or rebound, no hepato splenomegal no CVA tenderness.  No hernia. Extremtities:  No clubbing cyanosis or edema, no acute joint swelling or redness alert conversant NEURO:  Oriented x3, cranial nerves 3-12 appear to be intact, no obvious focal weakness gait is atypical toeing out leaning back but nonfocal SKIN: No acute rashes normal turgor, color, no bruising or petechiae.  There has overall thinning PSYCH: Oriented, good eye contact, no obvious depression anxiety, cognition and judgment appear normal. LN: no cervical axillary inguinal adenopathy  Lab Results  Component Value Date   WBC 9.2 08/12/2021   HGB 13.9 08/12/2021   HCT 41.0 08/12/2021   PLT 298 08/12/2021   GLUCOSE 86 08/12/2021   CHOL 147 08/12/2021   TRIG 133.0 08/12/2021   HDL 49.80 08/12/2021   LDLDIRECT 66.8 05/30/2013   LDLCALC 71 08/12/2021   ALT 11 08/12/2021   AST 12 08/12/2021   NA 136 08/12/2021   K 3.9 08/12/2021   CL 102 08/12/2021   CREATININE 0.58 08/12/2021   BUN 9 08/12/2021   CO2 26 08/12/2021   TSH 1.49 08/12/2021   HGBA1C 4.1 (L) 08/12/2021    BP Readings from Last 3 Encounters:  10/20/21 100/70  08/12/21 106/70  03/17/21 100/64    Lab results reviewed with patient  and momo   ASSESSMENT AND PLAN:  Discussed the following assessment and plan:    ICD-10-CM   1.  Visit for preventive health examination  Z00.00     2. Hyperinsulinemia  E16.1     3. Medication management  Z79.899     4. Developmental delay disorder  R62.50  5. PCOS (polycystic ovarian syndrome)  E28.2     6. 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 Iron deficiency  E61.1     Stomach doing much better on reduced dose of metformin or off she wants to remain on 1 a day.  Will do. Routine monitoring. She is not anemic but has slight iron deficiency and heavy periods. Discussed iron supplementation Mom was interested in ablation she herself it had through her GYN.  And would not be a candidate for having children.   Discussoptions with OB/GYN  Return in about 6 months (around 04/20/2022) for Fu meds and iron .  Patient Care Team: Lameisha Schuenemann, Neta Mends, MD as PCP - General (Internal Medicine) Talmage Coin, MD as Attending Physician (Endocrinology) Patient Instructions  Good to see you today. Ok to stay on lower dose of metformin since  your  stomach is doing better.   Can take iron every day or every other day  for lower iron level  You are not anemic  now  Stay on b12  supplement    Consider GYNE consult about heavy periods . Plan fu 4-6 mos and we can recheck iron  levels     Burna Mortimer K. Denzil Bristol M.D.

## 2021-10-20 NOTE — Patient Instructions (Addendum)
Good to see you today. Ok to stay on lower dose of metformin since  your  stomach is doing better.   Can take iron every day or every other day  for lower iron level  You are not anemic  now  Stay on b12  supplement    Consider GYNE consult about heavy periods . Plan fu 4-6 mos and we can recheck iron  levels

## 2021-12-27 ENCOUNTER — Other Ambulatory Visit: Payer: Self-pay | Admitting: Internal Medicine

## 2022-03-12 ENCOUNTER — Encounter: Payer: Self-pay | Admitting: Gastroenterology

## 2022-03-26 ENCOUNTER — Other Ambulatory Visit: Payer: Self-pay | Admitting: Internal Medicine

## 2022-04-20 ENCOUNTER — Ambulatory Visit (INDEPENDENT_AMBULATORY_CARE_PROVIDER_SITE_OTHER): Payer: BC Managed Care – PPO | Admitting: Internal Medicine

## 2022-04-20 ENCOUNTER — Ambulatory Visit: Payer: BC Managed Care – PPO | Admitting: Internal Medicine

## 2022-04-20 ENCOUNTER — Encounter: Payer: Self-pay | Admitting: Internal Medicine

## 2022-04-20 VITALS — BP 104/76 | HR 69 | Temp 97.9°F | Ht 63.0 in | Wt 139.6 lb

## 2022-04-20 DIAGNOSIS — G4729 Other circadian rhythm sleep disorder: Secondary | ICD-10-CM

## 2022-04-20 DIAGNOSIS — Z79899 Other long term (current) drug therapy: Secondary | ICD-10-CM

## 2022-04-20 DIAGNOSIS — R6250 Unspecified lack of expected normal physiological development in childhood: Secondary | ICD-10-CM | POA: Diagnosis not present

## 2022-04-20 DIAGNOSIS — E611 Iron deficiency: Secondary | ICD-10-CM

## 2022-04-20 DIAGNOSIS — G479 Sleep disorder, unspecified: Secondary | ICD-10-CM | POA: Diagnosis not present

## 2022-04-20 DIAGNOSIS — E063 Autoimmune thyroiditis: Secondary | ICD-10-CM | POA: Diagnosis not present

## 2022-04-20 DIAGNOSIS — E161 Other hypoglycemia: Secondary | ICD-10-CM

## 2022-04-20 MED ORDER — RAMELTEON 8 MG PO TABS: 8.0000 mg | ORAL_TABLET | Freq: Every day | ORAL | 1 refills | Status: DC

## 2022-04-20 MED ORDER — METFORMIN HCL ER 750 MG PO TB24: 750.0000 mg | ORAL_TABLET | Freq: Every day | ORAL | 3 refills | Status: DC

## 2022-04-20 NOTE — Progress Notes (Signed)
Chief Complaint  Patient presents with   Follow-up    Sleeping too much  Depression  Weight gain     HPI: Elizabeth Cantrell 31 y.o. come in for Chronic disease management with mom Mom is concerned that she is sleeping too much basically sleeps through the day and cannot sleep at night.  Melatonin is not working as well States that she is always had a problem but did better when she was in school a long time ago leave the light on at night because of fear of the dark. Her schedule is now not full and minimal volunteer at [employer] or getting out.  She does like to shop with mom.  GI  : saw gi 5 22  declined endoscopy    still taking iron off and on  but had novasure procedure in April so no periods  So I think the stomach symptoms are better     on iron b12 and lower dose of metformin I believe only taking once a day.  Mom concerned because she is eating "all the time" and is worried about weight gain.   ROS: See pertinent positives and negatives per HPI.  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 Testing for Rett Syndrome normal.   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    Family History  Problem Relation Age of Onset   Diabetes Mother    Hypertension Mother    Hyperlipidemia Other        parent grandparent    Social History   Socioeconomic History   Marital status: Single    Spouse name: Not on file    Number of children: Not on file   Years of education: 12   Highest education level: Not on file  Occupational History   Not on file  Tobacco Use   Smoking status: Never   Smokeless tobacco: Never  Vaping Use   Vaping Use: Never used  Substance and Sexual Activity   Alcohol use: No   Drug use: No   Sexual activity: Not on file  Other Topics Concern   Not on file  Social History Narrative   hh of  3   [employer]  [employer] [place] gets    Neg tad   6 hours sleep gets speech therapy at school ( no ot pt)   Patient lives at home with her parents.   Caffeine Use: none   No pets    Social Determinants of Corporate investment banker Strain: Not on file  Food Insecurity: Not on file  Transportation Needs: Not on file  Physical Activity: Not on file  Stress: Not on file  Social Connections: Not on file    Outpatient Medications Prior to Visit  Medication Sig Dispense Refill   bifidobacterium infantis (ALIGN) capsule Take 1 capsule by mouth daily. 30 capsule 5   Biotin 1000 MCG tablet Take 1,000 mcg by mouth daily.     cyanocobalamin 100 MCG tablet Take 100 mcg by mouth daily.     pantoprazole (PROTONIX) 40 MG tablet Take 1 tablet (40 mg total) by mouth every morning. 90 tablet 3   Probiotic Product (ALIGN PO) Take 1 capsule by mouth daily.     metFORMIN (GLUCOPHAGE-XR) 750 MG 24 hr tablet TAKE ONE TABLET BY MOUTH TWICE DAILY 180 tablet 0   No facility-administered medications prior to visit.     EXAM:  BP 104/76 (BP Location: Right Arm, Patient Position: Sitting, Cuff Size: Normal)   Pulse 69   Temp 97.9 F (36.6 C) (Oral)   Ht 5\' 3"  (1.6 m)   Wt 139 lb 9.6 oz (63.3 kg)   LMP 03/08/2022   SpO2 99%   BMI 24.73 kg/m   Body mass index is 24.73 kg/m.  GENERAL: vitals reviewed and listed above, alert, oriented, appears well hydrated and in no acute distress neuro active mom does a lot of the talking but she is quite interactive Some conversation in no acute  distress MS: moves all extremities without noticeable focal  abnormality Gait toeing out PSYCH: pleasant and cooperative, Lab Results  Component Value Date   WBC 9.2 08/12/2021   HGB 13.9 08/12/2021   HCT 41.0 08/12/2021   PLT 298 08/12/2021   GLUCOSE 86 08/12/2021   CHOL 147 08/12/2021   TRIG 133.0 08/12/2021   HDL 49.80 08/12/2021   LDLDIRECT 66.8 05/30/2013   LDLCALC 71 08/12/2021   ALT 11 08/12/2021   AST 12 08/12/2021   NA 136 08/12/2021   K 3.9 08/12/2021   CL 102 08/12/2021   CREATININE 0.58 08/12/2021   BUN 9 08/12/2021   CO2 26 08/12/2021   TSH 1.49 08/12/2021   HGBA1C 4.1 (L) 08/12/2021   BP Readings from Last 3 Encounters:  04/20/22 104/76  10/20/21 100/70  08/12/21 106/70    ASSESSMENT AND PLAN:  Discussed the following assessment and plan:  Reversed sleep wake cycle - Plan: TSH, T4, Free, CBC with Differential/Platelets, Iron, TIBC and Ferritin Panel, Basic Metabolic Panel, Hepatic Function Panel, CANCELED: TSH, CANCELED: T4, free, CANCELED: CBC with Differential/Platelet, CANCELED: Iron, TIBC and Ferritin Panel, CANCELED: Basic metabolic panel, CANCELED: Hepatic function panel, CANCELED: Hepatic function panel, CANCELED: Basic metabolic panel, CANCELED: Iron, TIBC and Ferritin Panel, CANCELED: CBC with Differential/Platelet, CANCELED: T4, free, CANCELED: TSH  Medication management - Plan: TSH, T4, Free, CBC with Differential/Platelets, Iron, TIBC and Ferritin Panel, Basic Metabolic Panel, Hepatic Function Panel, CANCELED: TSH, CANCELED: T4, free, CANCELED: CBC with Differential/Platelet, CANCELED: Iron, TIBC and Ferritin Panel, CANCELED: Basic metabolic panel, CANCELED: Hepatic function panel, CANCELED: Hepatic function panel, CANCELED: Basic metabolic panel, CANCELED: Iron, TIBC and Ferritin Panel, CANCELED: CBC with Differential/Platelet, CANCELED: T4, free, CANCELED: TSH  Iron deficiency - Plan: TSH, T4, Free, CBC with Differential/Platelets, Iron, TIBC and  Ferritin Panel, Basic Metabolic Panel, Hepatic Function Panel, CANCELED: TSH, CANCELED: T4, free, CANCELED: CBC with Differential/Platelet, CANCELED: Iron, TIBC and Ferritin Panel, CANCELED: Basic metabolic panel, CANCELED: Hepatic function panel, CANCELED: Hepatic function panel, CANCELED: Basic metabolic panel, CANCELED: Iron, TIBC and Ferritin Panel, CANCELED: CBC with Differential/Platelet, CANCELED: T4, free, CANCELED: TSH  Developmental delay disorder - Plan: TSH, T4, Free, CBC with Differential/Platelets, Iron, TIBC and Ferritin Panel, Basic Metabolic Panel, Hepatic Function Panel, CANCELED: TSH, CANCELED: T4,  free, CANCELED: CBC with Differential/Platelet, CANCELED: Iron, TIBC and Ferritin Panel, CANCELED: Basic metabolic panel, CANCELED: Hepatic function panel, CANCELED: Hepatic function panel, CANCELED: Basic metabolic panel, CANCELED: Iron, TIBC and Ferritin Panel, CANCELED: CBC with Differential/Platelet, CANCELED: T4, free, CANCELED: TSH  Thyroiditis, autoimmune - Plan: TSH, T4, Free, CBC with Differential/Platelets, Iron, TIBC and Ferritin Panel, Basic Metabolic Panel, Hepatic Function Panel, CANCELED: TSH, CANCELED: T4, free, CANCELED: CBC with Differential/Platelet, CANCELED: Iron, TIBC and Ferritin Panel, CANCELED: Basic metabolic panel, CANCELED: Hepatic function panel, CANCELED: Hepatic function panel, CANCELED: Basic metabolic panel, CANCELED: Iron, TIBC and Ferritin Panel, CANCELED: CBC with Differential/Platelet, CANCELED: T4, free, CANCELED: TSH  Hyperinsulinemia - Plan: TSH, T4, Free, CBC with Differential/Platelets, Iron, TIBC and Ferritin Panel, Basic Metabolic Panel, Hepatic Function Panel, CANCELED: TSH, CANCELED: T4, free, CANCELED: CBC with Differential/Platelet, CANCELED: Iron, TIBC and Ferritin Panel, CANCELED: Basic metabolic panel, CANCELED: Hepatic function panel, CANCELED: Hepatic function panel, CANCELED: Basic metabolic panel, CANCELED: Iron, TIBC and Ferritin Panel,  CANCELED: CBC with Differential/Platelet, CANCELED: T4, free, CANCELED: TSH  Disordered sleep - Plan: TSH, T4, Free, CBC with Differential/Platelets, Iron, TIBC and Ferritin Panel, Basic Metabolic Panel, Hepatic Function Panel, CANCELED: TSH, CANCELED: T4, free, CANCELED: CBC with Differential/Platelet, CANCELED: Iron, TIBC and Ferritin Panel, CANCELED: Basic metabolic panel, CANCELED: Hepatic function panel, CANCELED: Hepatic function panel, CANCELED: Basic metabolic panel, CANCELED: Iron, TIBC and Ferritin Panel, CANCELED: CBC with Differential/Platelet, CANCELED: T4, free, CANCELED: TSH Sleep is reversed cycled discussed using Rosa room if covered and changing sleep time monitoring and scheduled Unclear how lack of social activity and or depressive symptoms are contributing. Discussed scheduling sleep phase changing Check iron and thyroid etc. today. Some secondary mood is possible though would not suggest that medication is indicated. Discussed with patient and mom mom states that "she does not listen to [place]."  But will be supportive  -Patient advised to return or notify health care team  if  new concerns arise.  Patient Instructions  Track and cycle your sleep . Get up around same time each day even though you are sleepy. Take  new med  1 hour before sleep.   Move  sleep time up 30 - 60 minutes every 2-3 nights until sleep is I at night.  Schedule  something to do in day and do it.   Consider  sleep mask .  Checking iron today . Ok to stay on one metformin  per day   Consider seeing sleep specialist for more ideas.     Neta Mends. Ledon Weihe M.D.

## 2022-04-21 ENCOUNTER — Other Ambulatory Visit (INDEPENDENT_AMBULATORY_CARE_PROVIDER_SITE_OTHER): Payer: BC Managed Care – PPO

## 2022-04-21 ENCOUNTER — Other Ambulatory Visit: Payer: BC Managed Care – PPO

## 2022-04-21 DIAGNOSIS — E161 Other hypoglycemia: Secondary | ICD-10-CM

## 2022-04-21 DIAGNOSIS — Z79899 Other long term (current) drug therapy: Secondary | ICD-10-CM

## 2022-04-21 DIAGNOSIS — G479 Sleep disorder, unspecified: Secondary | ICD-10-CM

## 2022-04-21 DIAGNOSIS — R6250 Unspecified lack of expected normal physiological development in childhood: Secondary | ICD-10-CM

## 2022-04-21 DIAGNOSIS — G4729 Other circadian rhythm sleep disorder: Secondary | ICD-10-CM

## 2022-04-21 DIAGNOSIS — E611 Iron deficiency: Secondary | ICD-10-CM

## 2022-04-21 DIAGNOSIS — E063 Autoimmune thyroiditis: Secondary | ICD-10-CM | POA: Diagnosis not present

## 2022-04-21 LAB — HEPATIC FUNCTION PANEL
ALT: 12 U/L (ref 0–35)
AST: 14 U/L (ref 0–37)
Albumin: 4.4 g/dL (ref 3.5–5.2)
Alkaline Phosphatase: 73 U/L (ref 39–117)
Bilirubin, Direct: 0.2 mg/dL (ref 0.0–0.3)
Total Bilirubin: 0.7 mg/dL (ref 0.2–1.2)
Total Protein: 8.3 g/dL (ref 6.0–8.3)

## 2022-04-21 LAB — BASIC METABOLIC PANEL
BUN: 7 mg/dL (ref 6–23)
CO2: 28 mEq/L (ref 19–32)
Calcium: 9.6 mg/dL (ref 8.4–10.5)
Chloride: 101 mEq/L (ref 96–112)
Creatinine, Ser: 0.65 mg/dL (ref 0.40–1.20)
GFR: 117.5 mL/min (ref >60.00)
Glucose, Bld: 114 mg/dL — ABNORMAL HIGH (ref 70–99)
Potassium: 4.2 mEq/L (ref 3.5–5.1)
Sodium: 136 mEq/L (ref 135–145)

## 2022-04-21 LAB — T4, FREE: Free T4: 1.09 ng/dL (ref 0.60–1.60)

## 2022-04-21 LAB — TSH: TSH: 0.93 u[IU]/mL (ref 0.35–5.50)

## 2022-04-22 LAB — IRON,TIBC AND FERRITIN PANEL
%SAT: 33 % (calc) (ref 16–45)
Ferritin: 17 ng/mL (ref 16–154)
Iron: 107 ug/dL (ref 40–190)
TIBC: 329 mcg/dL (calc) (ref 250–450)

## 2022-04-22 LAB — CBC WITH DIFFERENTIAL/PLATELET
Absolute Monocytes: 451 cells/uL (ref 200–950)
Basophils Absolute: 41 cells/uL (ref 0–200)
Basophils Relative: 0.5 %
Eosinophils Absolute: 82 cells/uL (ref 15–500)
Eosinophils Relative: 1 %
HCT: 43.9 % (ref 35.0–45.0)
Hemoglobin: 15.5 g/dL (ref 11.7–15.5)
Lymphs Abs: 2411 cells/uL (ref 850–3900)
MCH: 31.1 pg (ref 27.0–33.0)
MCHC: 35.3 g/dL (ref 32.0–36.0)
MCV: 88.2 fL (ref 80.0–100.0)
MPV: 11.8 fL (ref 7.5–12.5)
Monocytes Relative: 5.5 %
Neutro Abs: 5215 cells/uL (ref 1500–7800)
Neutrophils Relative %: 63.6 %
Platelets: 260 10*3/uL (ref 140–400)
RBC: 4.98 10*6/uL (ref 3.80–5.10)
RDW: 14.3 % (ref 11.0–15.0)
Total Lymphocyte: 29.4 %
WBC: 8.2 10*3/uL (ref 3.8–10.8)

## 2022-04-23 NOTE — Progress Notes (Signed)
Thyroid normal range . No anemia  and iron level is  better in normal range this time .  Could decrease the iron  . : maybe a few day per week  or can try a multivitamin with iron   to see if  no more  stomach problems .

## 2022-06-02 ENCOUNTER — Encounter: Payer: Self-pay | Admitting: Internal Medicine

## 2022-06-02 MED ORDER — NIRMATRELVIR/RITONAVIR (PAXLOVID)TABLET: 3.0000 | ORAL_TABLET | Freq: Two times a day (BID) | ORAL | 0 refills | Status: AC

## 2022-06-02 NOTE — Progress Notes (Signed)
See todays message  rx sent to [facility]

## 2022-06-02 NOTE — Telephone Encounter (Unsigned)
Ok you convinced me that benefit more than risk  in  your situation  Need to start within 5 days of onset of sx and documented covid .  Will send in paxlovid to  [facility]  pharmacy

## 2022-06-30 ENCOUNTER — Ambulatory Visit: Payer: BC Managed Care – PPO | Admitting: Internal Medicine

## 2022-08-06 ENCOUNTER — Ambulatory Visit: Payer: BC Managed Care – PPO | Admitting: Family Medicine

## 2022-08-06 ENCOUNTER — Ambulatory Visit (INDEPENDENT_AMBULATORY_CARE_PROVIDER_SITE_OTHER): Payer: BC Managed Care – PPO

## 2022-08-06 ENCOUNTER — Ambulatory Visit (INDEPENDENT_AMBULATORY_CARE_PROVIDER_SITE_OTHER): Payer: BC Managed Care – PPO | Admitting: Family Medicine

## 2022-08-06 ENCOUNTER — Encounter: Payer: Self-pay | Admitting: Family Medicine

## 2022-08-06 VITALS — BP 106/88 | HR 80 | Temp 98.1°F | Resp 12 | Ht 63.0 in | Wt 148.5 lb

## 2022-08-06 DIAGNOSIS — J029 Acute pharyngitis, unspecified: Secondary | ICD-10-CM | POA: Diagnosis not present

## 2022-08-06 DIAGNOSIS — R052 Subacute cough: Secondary | ICD-10-CM

## 2022-08-06 LAB — POCT RAPID STREP A (OFFICE): Rapid Strep A Screen: NEGATIVE

## 2022-08-06 MED ORDER — BENZONATATE 100 MG PO CAPS: 100.0000 mg | ORAL_CAPSULE | Freq: Three times a day (TID) | ORAL | 0 refills | Status: AC | PRN

## 2022-08-06 NOTE — Patient Instructions (Addendum)
A few things to remember from today's visit:  Sore throat - Plan: POC Rapid Strep A, Culture, Group A Strep  Subacute cough - Plan: benzonatate (TESSALON) 100 MG capsule, DG Chest 2 View ???? Cough, Adult ?????????????????????????????????????????????????????????????????????????????????????????????????? 2-3 ??????????? 8 ??????? ?????????????? ???????????? ??????????? ???? ??? ????????????????? ??? ????????????????? ????? ????? ?????????????????????? ????????? ?? ????????????????????????? ????????????????????? ????  ???????????????????????????????????????????????????????????? ???????????????????? ?????? ??????????????????????? ????  ??????????????????????? ?????????? ???????????????????????????????? ???????????????-??????????????????????? ?????????????????????? ????????? ?????????????????????????????? ????????????????? ??????? ????? ?? 2-3 ?????????? ????????????????? ?????????? ??? ???????????? ??? ?????????????? ?????? ?????? ?????? ???????????????????????????????????????????????????? 911???????????? ?? ???????????????????????????????????????????????????????????????????? ????????????????????????? ?????????? ????????????? 2-3 ??????????????????? ???????????????????????????????????????????????? Document Revised: 11/23/2018 Document Reviewed: 11/23/2018 Elsevier Patient Education  2023 [employer]  Examination nd history do not suggest pneumonia. Plain mucinex may help. Adequate hydration. We checked for strep and negative.We are sending the sample for culture. We are doing a chest X ray today. Monitor for fever or worsening symptoms.  Please be sure medication list is accurate. If a new problem present, please set up appointment sooner than planned today.  Cough, Adult A cough helps to clear your throat and lungs. A cough may be a sign of an illness or another medical condition. An acute cough may only last 2-3 weeks, while a chronic cough  may last 8 or more weeks. Many things can cause a cough. They include: Germs (viruses or bacteria) that attack the airway. Breathing in things that bother (irritate) your lungs. Allergies. Asthma. Mucus that runs down the back of your throat (postnasal drip). Smoking. Acid backing up from the stomach into the tube that moves food from the mouth to the stomach (gastroesophageal reflux). Some medicines. Lung problems. Other medical conditions, such as heart failure or a blood clot in the lung (pulmonary embolism). Follow these instructions at home: Medicines Take over-the-counter and prescription medicines only as told by your doctor. Talk with your doctor before you take medicines that stop a cough (cough suppressants). Lifestyle  Do not smoke, and try not to be around smoke. Do not use any products that contain nicotine or tobacco, such as cigarettes, e-cigarettes, and chewing tobacco. If you need help quitting, ask your doctor. Drink enough fluid to keep your pee (urine) pale yellow. Avoid caffeine. Do not drink alcohol if your doctor tells you not to drink. General instructions  Watch for any changes in your cough. Tell your doctor about them. Always cover your mouth when you cough. Stay away from things that make you cough, such as perfume, candles, campfire smoke, or cleaning products. If the air is dry, use a cool mist vaporizer or humidifier in your home. If your cough is worse at night, try using extra pillows to raise your head up higher while you sleep. Rest as needed. Keep all follow-up visits as told by your doctor. This is important. Contact a doctor if: You have new symptoms. You cough up pus. Your cough does not get better after 2-3 weeks, or your cough gets worse. Cough medicine does not help your cough and you are not sleeping well. You have pain that gets worse or pain that is not helped with medicine. You have a fever. You are losing weight and you do not know  why. You have night sweats. Get help right away if: You cough up blood. You have trouble breathing. Your heartbeat is very fast. These symptoms may be an emergency. Do not wait to see if the symptoms will go away. Get medical help right away. Call your local emergency services (911 in the [place] Do not drive yourself  to the hospital. Summary A cough helps to clear your throat and lungs. Many things can cause a cough. Take over-the-counter and prescription medicines only as told by your doctor. Always cover your mouth when you cough. Contact a doctor if you have new symptoms or you have a cough that does not get better or gets worse. This information is not intended to replace advice given to you by your health care provider. Make sure you discuss any questions you have with your health care provider. Document Revised: 11/30/2019 Document Reviewed: 10/30/2018 Elsevier Patient Education  [employer].

## 2022-08-06 NOTE — Progress Notes (Signed)
ACUTE VISIT Chief Complaint  Patient presents with   Cough    Cough for 3weeks little bit of yellow mucous and Returning from [place].   HPI: Ms.Jahniah Hendler is a 31 y.o. female with medical history significant for PCOS, developmental delay, anemia, and prediabetes here today with her mother complaining of persistent cough for over a month.  Her mother provides hx, she is able to answer yes and no to questions. Productive cough with "little bit" of yellowish sputum.  Cough began while she was in [place] for one and a half months and has continued since her return on Wednesday. She reports experiencing a mild fever and sore throat during the first three days of the cough, for which she took Theraflu without significant improvement. Negative for any recent exposure to individuals with similar symptoms.  She denies any associated wheezing, difficulty breathing, or chest pain. Her mother reports having taken antibiotics in [place] but is unsure of their effectiveness. Her symptoms have fluctuated, with periods of feeling better and worse.  Symptoms seem exacerbated by cold weather. She has no known history of allergies or asthma, does not smoke.  Cough This is a new problem. The current episode started 1 to 4 weeks ago. The problem has been waxing and waning. The cough is Productive of sputum. Associated symptoms include nasal congestion, postnasal drip and rhinorrhea. Pertinent negatives include no chest pain, chills, ear congestion, ear pain, eye redness, fever, headaches, heartburn (Takes Omeprazole 40 mg daily), hemoptysis, myalgias, rash, shortness of breath, weight loss or wheezing. Nothing aggravates the symptoms. There is no history of asthma or environmental allergies.   Her mother is concerned about strep throat but is not currently experiencing sore throat or pain. She inquires about the possibility of a throat swab to determine appropriate "medication sensitivity."  Review of Systems   Constitutional:  Negative for activity change, appetite change, chills, fever and weight loss.  HENT:  Positive for postnasal drip and rhinorrhea. Negative for ear pain.   Eyes:  Negative for discharge and redness.  Respiratory:  Positive for cough. Negative for hemoptysis, shortness of breath and wheezing.   Cardiovascular:  Negative for chest pain.  Gastrointestinal:  Negative for abdominal pain, heartburn (Takes Omeprazole 40 mg daily), nausea and vomiting.  Genitourinary:  Negative for decreased urine volume, dysuria and hematuria.  Musculoskeletal:  Negative for myalgias.  Skin:  Negative for rash.  Allergic/Immunologic: Negative for environmental allergies.  Neurological:  Negative for headaches.  Rest see pertinent positives and negatives per HPI.  Current Outpatient Medications on File Prior to Visit  Medication Sig Dispense Refill   bifidobacterium infantis (ALIGN) capsule Take 1 capsule by mouth daily. 30 capsule 5   Biotin 1000 MCG tablet Take 1,000 mcg by mouth daily.     cyanocobalamin 100 MCG tablet Take 100 mcg by mouth daily.     metFORMIN (GLUCOPHAGE-XR) 750 MG 24 hr tablet Take 1 tablet (750 mg total) by mouth daily with breakfast. 90 tablet 3   pantoprazole (PROTONIX) 40 MG tablet Take 1 tablet (40 mg total) by mouth every morning. 90 tablet 3   Probiotic Product (ALIGN PO) Take 1 capsule by mouth daily.     ramelteon (ROZEREM) 8 MG tablet Take 1 tablet (8 mg total) by mouth at bedtime. 30 tablet 1   No current facility-administered medications on file prior to visit.   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 Testing for Rett Syndrome normal.   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   No Known Allergies  Social History   Socioeconomic History   Marital status: Single    Spouse name: Not on file   Number of children: Not on file   Years of education: 12   Highest education level: Not on file  Occupational History   Not on file  Tobacco Use   Smoking status: Never   Smokeless tobacco: Never  Vaping Use   Vaping Use: Never used  Substance and Sexual Activity   Alcohol use: No   Drug use: No   Sexual activity: Not on file  Other Topics Concern   Not on file  Social History Narrative   hh of  3   [employer]  [employer]    Neg tad   6 hours sleep gets speech therapy at school ( no ot pt)   Patient lives at home with her parents.   Caffeine Use: none   No pets    Social Determinants of Corporate investment banker Strain: Not on file  Food Insecurity: Not on file  Transportation Needs: Not on file  Physical Activity: Not on file  Stress: Not on file  Social Connections: Not on file   Vitals:   08/06/22 1111  BP: 106/88  Pulse: 80  Resp: 12  Temp: 98.1 F (36.7 C)  SpO2: 98%   Body mass index is 26.31 kg/m.  Physical Exam Vitals and nursing note reviewed.  Constitutional:      General: She is not in acute distress.    Appearance: She is not ill-appearing.  HENT:     Head: Normocephalic and atraumatic.     Right Ear: Tympanic membrane, ear canal and external ear normal.     Left Ear: Tympanic membrane, ear canal and external ear normal.     Nose: Septal deviation and rhinorrhea present. No congestion.     Right Turbinates: Enlarged.     Left Turbinates: Enlarged.     Right Sinus: No maxillary sinus tenderness or frontal sinus  tenderness.     Left Sinus: No maxillary sinus tenderness or frontal sinus tenderness.     Mouth/Throat:     Mouth: Mucous membranes are moist.     Pharynx: Oropharynx is clear. Uvula midline. No oropharyngeal exudate or posterior oropharyngeal erythema.     Tonsils: 2+ on the right. 2+ on the left.  Eyes:     Conjunctiva/sclera: Conjunctivae normal.  Cardiovascular:     Rate and Rhythm: Normal rate and regular rhythm.     Heart sounds: No murmur heard. Pulmonary:     Effort: Pulmonary effort is normal. No respiratory distress.     Breath sounds: Normal breath sounds. No stridor.  Lymphadenopathy:     Head:  Right side of head: No submandibular adenopathy.     Left side of head: No submandibular adenopathy.     Cervical: No cervical adenopathy.  Skin:    General: Skin is warm.     Findings: No erythema or rash.  Neurological:     Mental Status: She is alert. Mental status is at baseline.     Comments: Gait is not assisted.  Psychiatric:        Behavior: Behavior is cooperative.   ASSESSMENT AND PLAN:  Ms.Rama was seen today for cough.  Diagnoses and all orders for this visit: Orders Placed This Encounter  Procedures   Culture, Group A Strep   DG Chest 2 View   POC Rapid Strep A   Sore throat Her mother is concerned about strep throat, she has not c/o pain and eating normal. Examination today does not suggest strep infection. Rapid strep negative, will sent sample for Cx.  Subacute cough Started while she was in [place], apparently she was treated with abx but no significant improvement. In general symptoms are stable. Today lung auscultation negative. We discussed possible etiologies, including seasonal allergies, post viral cough. Continue monitoring for fever. CXR ordered today. I do not think blood work is needed. Benzonatate prescribed for cough. OTC plain Mucinex will also help. Instructed about warning signs.  -     benzonatate (TESSALON) 100 MG  capsule; Take 1 capsule (100 mg total) by mouth 3 (three) times daily as needed for up to 10 days.  Return if symptoms worsen or fail to improve.  Andyn Sales G. [place], MD  [facility]. [place] office.

## 2022-08-09 ENCOUNTER — Other Ambulatory Visit: Payer: Self-pay | Admitting: Family Medicine

## 2022-08-09 MED ORDER — SPACER/AERO-HOLD CHAMBER MASK MISC: 1.0000 | Freq: Four times a day (QID) | 0 refills | Status: DC | PRN

## 2022-08-09 MED ORDER — ALBUTEROL SULFATE HFA 108 (90 BASE) MCG/ACT IN AERS: 2.0000 | INHALATION_SPRAY | Freq: Four times a day (QID) | RESPIRATORY_TRACT | 0 refills | Status: DC | PRN

## 2022-08-18 ENCOUNTER — Ambulatory Visit (INDEPENDENT_AMBULATORY_CARE_PROVIDER_SITE_OTHER): Payer: BC Managed Care – PPO | Admitting: Internal Medicine

## 2022-08-18 VITALS — BP 108/78 | HR 74 | Temp 97.6°F | Wt 144.2 lb

## 2022-08-18 DIAGNOSIS — R058 Other specified cough: Secondary | ICD-10-CM

## 2022-08-18 DIAGNOSIS — R6250 Unspecified lack of expected normal physiological development in childhood: Secondary | ICD-10-CM

## 2022-08-18 DIAGNOSIS — Z79899 Other long term (current) drug therapy: Secondary | ICD-10-CM

## 2022-08-18 NOTE — Patient Instructions (Addendum)
Lungs are clear .  Today . An use inhaler as needed   Expect continued improvement but if  not resolving in another month let us know

## 2022-08-18 NOTE — Progress Notes (Unsigned)
Chief Complaint  Patient presents with   Follow-up    HPI: Elizabeth Cantrell 31 y.o. come in for Fu   here with mom ...  recent trip to [place]  developed  coughing illness  neg fever  saw Dr [place] and inhaler made it much better   no fever   still has a minor cough throat clear but doing well  Ate well  when traveled  10 pounds   doing better  no sig Gi sx   Mom is pleased  with her status   ROS: See pertinent positives and negatives per HPI. 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 Testing for Rett Syndrome normal.   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    Family History  Problem Relation Age of Onset   Diabetes Mother    Hypertension Mother    Hyperlipidemia Other        parent grandparent    Social History   Socioeconomic History   Marital status: Single    Spouse name: Not on file   Number of children: Not on file   Years of education: 12   Highest education level: Not on file  Occupational History   Not on file  Tobacco Use   Smoking status: Never   Smokeless tobacco: Never  Vaping Use   Vaping Use: Never used  Substance and Sexual Activity   Alcohol use: No   Drug use: No   Sexual activity: Not on file  Other Topics Concern   Not on file  Social History Narrative   hh of  3   [employer]  [employer]    Neg tad   6 hours sleep gets speech  therapy at school ( no ot pt)   Patient lives at home with her parents.   Caffeine Use: none   No pets    Social Determinants of Corporate investment banker Strain: Not on file  Food Insecurity: Not on file  Transportation Needs: Not on file  Physical Activity: Not on file  Stress: Not on file  Social Connections: Not on file    Outpatient Medications Prior to Visit  Medication Sig Dispense Refill   bifidobacterium infantis (ALIGN) capsule Take 1 capsule by mouth daily. 30 capsule 5   Biotin 1000 MCG tablet Take 1,000 mcg by mouth daily.     cyanocobalamin 100 MCG tablet Take 100 mcg by mouth daily.     metFORMIN (GLUCOPHAGE-XR) 750 MG 24 hr tablet Take 1 tablet (750 mg total) by mouth daily with breakfast. 90 tablet 3   Probiotic Product (ALIGN PO) Take 1 capsule by mouth daily.     Spacer/Aero-Hold Clinical cytogeneticist MISC Inhale  1 Device into the lungs 4 (four) times daily as needed. To use with inhaler. 1 Device 0   albuterol (VENTOLIN HFA) 108 (90 Base) MCG/ACT inhaler Inhale 2 puffs into the lungs every 6 (six) hours as needed for wheezing or shortness of breath. (Patient not taking: Reported on 08/18/2022) 8 g 0   ramelteon (ROZEREM) 8 MG tablet Take 1 tablet (8 mg total) by mouth at bedtime. (Patient not taking: Reported on 08/18/2022) 30 tablet 1   pantoprazole (PROTONIX) 40 MG tablet Take 1 tablet (40 mg total) by mouth every morning. 90 tablet 3   No facility-administered medications prior to visit.     EXAM:  BP 108/78 (BP Location: Right Arm, Patient Position: Sitting, Cuff Size: Normal)   Pulse 74   Temp 97.6 F (36.4 C) (Oral)   Wt 144 lb 3.2 oz (65.4 kg)   LMP 01/16/2022 (Approximate)   SpO2 97%   BMI 25.54 kg/m   Body mass index is 25.54 kg/m.  GENERAL: vitals reviewed and listed above, alert, appears well hydrated and in no acute distress HEENT: atraumatic, conjunctiva  clear, no obvious abnormalities on inspection of external nose and ears tms nl OP : no  lesion edema or exudate  NECK: no obvious masses on inspection palpation  LUNGS: clear to auscultation bilaterally, no wheezes, rales or rhonchi, good air movement  ocass minor throat clearing cough CV: HRRR, no clubbing cyanosis or  peripheral edema nl cap refill  Non focal ms  PSYCH: pleasant and cooperative, Lab Results  Component Value Date   WBC 8.2 04/21/2022   HGB 15.5 04/21/2022   HCT 43.9 04/21/2022   PLT 260 04/21/2022   GLUCOSE 114 (H) 04/21/2022   CHOL 147 08/12/2021   TRIG 133.0 08/12/2021   HDL 49.80 08/12/2021   LDLDIRECT 66.8 05/30/2013   LDLCALC 71 08/12/2021   ALT 12 04/21/2022   AST 14 04/21/2022   NA 136 04/21/2022   K 4.2 04/21/2022   CL 101 04/21/2022   CREATININE 0.65 04/21/2022   BUN 7 04/21/2022   CO2 28 04/21/2022   TSH 0.93 04/21/2022   HGBA1C 4.1 (L) 08/12/2021   BP Readings from Last 3 Encounters:  08/18/22 108/78  08/06/22 106/88  04/20/22 104/76   Record review  ASSESSMENT AND PLAN:  Discussed the following assessment and plan:  Post-viral cough syndrome  Medication management  Developmental delay disorder Expect  continued improvement  disc  Doing better with gi and appetite   metformin poss  added to sx  18  min f to face and record reveiw -Patient advised to return or notify health care team  if  new concerns arise.  Patient Instructions  Lungs are clear .  Today . An use inhaler as needed   Expect continued improvement but if  not resolving in another month let us know    Standley Brooking. Elizabeth Cantrell M.D.

## 2022-08-19 ENCOUNTER — Other Ambulatory Visit: Payer: Self-pay | Admitting: Family Medicine

## 2022-08-19 ENCOUNTER — Encounter: Payer: Self-pay | Admitting: Internal Medicine

## 2022-08-20 ENCOUNTER — Other Ambulatory Visit: Payer: Self-pay | Admitting: Family Medicine

## 2022-08-22 ENCOUNTER — Other Ambulatory Visit: Payer: Self-pay | Admitting: Family Medicine

## 2022-08-23 ENCOUNTER — Other Ambulatory Visit: Payer: Self-pay | Admitting: Family Medicine

## 2022-08-24 MED ORDER — ALBUTEROL SULFATE HFA 108 (90 BASE) MCG/ACT IN AERS: 2.0000 | INHALATION_SPRAY | Freq: Four times a day (QID) | RESPIRATORY_TRACT | 0 refills | Status: DC | PRN

## 2022-11-09 DIAGNOSIS — M25561 Pain in right knee: Secondary | ICD-10-CM | POA: Diagnosis not present

## 2022-11-09 DIAGNOSIS — M25571 Pain in right ankle and joints of right foot: Secondary | ICD-10-CM | POA: Diagnosis not present

## 2022-11-15 DIAGNOSIS — M25561 Pain in right knee: Secondary | ICD-10-CM | POA: Diagnosis not present

## 2022-11-15 DIAGNOSIS — S8261XA Displaced fracture of lateral malleolus of right fibula, initial encounter for closed fracture: Secondary | ICD-10-CM | POA: Diagnosis not present

## 2022-11-23 ENCOUNTER — Ambulatory Visit: Payer: BC Managed Care – PPO | Admitting: Internal Medicine

## 2022-11-23 ENCOUNTER — Other Ambulatory Visit: Payer: Self-pay | Admitting: Internal Medicine

## 2022-11-29 DIAGNOSIS — S8261XA Displaced fracture of lateral malleolus of right fibula, initial encounter for closed fracture: Secondary | ICD-10-CM | POA: Diagnosis not present

## 2022-11-29 DIAGNOSIS — M25571 Pain in right ankle and joints of right foot: Secondary | ICD-10-CM | POA: Diagnosis not present

## 2022-12-02 DIAGNOSIS — M25561 Pain in right knee: Secondary | ICD-10-CM | POA: Diagnosis not present

## 2022-12-28 DIAGNOSIS — S8261XA Displaced fracture of lateral malleolus of right fibula, initial encounter for closed fracture: Secondary | ICD-10-CM | POA: Diagnosis not present

## 2023-01-03 ENCOUNTER — Ambulatory Visit: Payer: BC Managed Care – PPO | Admitting: Internal Medicine

## 2023-01-24 NOTE — Progress Notes (Signed)
Chief Complaint  Patient presents with   Annual Exam    HPI: Patient  Elizabeth Cantrell  32 y.o. comes in today for Preventive Health Care visit  and fu issues  Ankle fracture  unfortunately slip on wet kitchen fore and twisted fx ankle  in boot since jan  getting better  [facility]  Somewhat down since then Pos prandial abd pain using heating pad happneds about 15+ min after eating and last ? Hours or so uncertain  no assoc vomiting diarrhea noted   Neg tad no meds  Still taking  metformin 750 once dialy   Health Maintenance  Topic Date Due   PAP SMEAR-Modifier  Never done   COVID-19 Vaccine (5 - 2023-24 season) 02/10/2023 (Originally 06/25/2022)   Hepatitis C Screening  01/25/2024 (Originally 02/06/2009)   HIV Screening  01/25/2024 (Originally 02/06/2006)   INFLUENZA VACCINE  05/26/2023   DTaP/Tdap/Td (3 - Td or Tdap) 05/31/2023   HPV VACCINES  Aged Out   Doesn't have periods since    ROS:  GEN/ HEENT: No fever, significant weight changes sweats headaches vision problems hearing changes, CV/ PULM; No chest pain shortness of breath cough, syncope,edema  change in exercise tolerance. N exercising cause of frx  GI /GU: N. No blood in the stool. No significant GU symptoms. SKIN/HEME: ,no acute skin rashes suspicious lesions or bleeding. No lymphadenopathy, nodules, masses.  NEURO/ PSYCH:  No neurologic signs such as weakness numbness.somewhat down  IMM/ Allergy: No unusual infections.  Allergy .   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 Testing for Rett Syndrome normal.   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 No pertinent surgical history.  Family History  Problem Relation Age of Onset   Diabetes Mother    Hypertension Mother    Hyperlipidemia Other        parent grandparent    Social History   Socioeconomic History   Marital status: Single    Spouse name: Not on file   Number of children: Not on file   Years of education: 46   Highest education level: 12th grade  Occupational History   Not on file  Tobacco Use   Smoking status: Never   Smokeless tobacco: Never  Vaping Use   Vaping Use: Never used  Substance and Sexual Activity   Alcohol use: No   Drug use: No   Sexual activity: Not on file  Other Topics Concern   Not on file  Social History Narrative   hh of  3   [employer]  [employer] [place] gets    Neg tad   6 hours sleep gets speech therapy at school (  no ot pt)   Patient lives at home with her parents.   Caffeine Use: none   No pets    Social Determinants of Health   Financial Resource Strain: Low Risk  (01/18/2023)   Overall Financial Resource Strain (CARDIA)    Difficulty of Paying Living Expenses: Not hard at all  Food Insecurity: No Food Insecurity (01/18/2023)   Hunger Vital Sign    Worried About Running Out of Food in the Last Year: Never true    Ran Out of Food in the Last Year: Never true  Transportation Needs: No Transportation Needs (01/18/2023)   PRAPARE - Administrator, Civil Service (Medical): No    Lack of Transportation (Non-Medical): No  Physical Activity: Insufficiently Active (01/18/2023)   Exercise Vital Sign    Days of Exercise per Week: 1 day    Minutes of Exercise per Session: 20 min  Stress: No Stress Concern Present (01/18/2023)   [employer] of  Occupational Health - Occupational Stress Questionnaire    Feeling of Stress : Only a little  Social Connections: Unknown (01/18/2023)   Social Connection and Isolation Panel [NHANES]    Frequency of Communication with Friends and Family: Once a week    Frequency of Social Gatherings with Friends and Family: Never    Attends Religious Services: Never    Database administrator or Organizations: No    Attends Engineer, structural: Not on file    Marital Status: Patient declined    Outpatient Medications Prior to Visit  Medication Sig Dispense Refill   metFORMIN (GLUCOPHAGE-XR) 750 MG 24 hr tablet TAKE ONE TABLET BY MOUTH TWICE DAILY 180 tablet 0   Probiotic Product (ALIGN PO) Take 1 capsule by mouth daily.     albuterol (VENTOLIN HFA) 108 (90 Base) MCG/ACT inhaler Inhale 2 puffs into the lungs every 6 (six) hours as needed for wheezing or shortness of breath. (Patient not taking: Reported on 01/25/2023) 18 g 0   bifidobacterium infantis (ALIGN) capsule Take 1 capsule by mouth daily. (Patient not taking: Reported on 01/25/2023) 30 capsule 5   Biotin 1000 MCG tablet Take 1,000 mcg by mouth daily. (Patient not taking: Reported on 01/25/2023)     cyanocobalamin 100 MCG tablet Take 100 mcg by mouth daily. (Patient not taking: Reported on 01/25/2023)     ramelteon (ROZEREM) 8 MG tablet Take 1 tablet (8 mg total) by mouth at bedtime. (Patient not taking: Reported on 01/25/2023) 30 tablet 1   Spacer/Aero-Hold Chamber Mask MISC Inhale 1 Device into the lungs 4 (four) times daily as needed. To use with inhaler. (Patient not taking: Reported on 01/25/2023) 1 Device 0   No facility-administered medications prior to visit.     EXAM:  BP 110/76 (BP Location: Right Arm, Patient Position: Sitting, Cuff Size: Normal)   Pulse 73   Temp 97.8 F (36.6 C) (Oral)   Ht 5\' 3"  (1.6 m)   Wt 148 lb (67.1 kg)   SpO2 99%   BMI 26.22 kg/m   Body mass index is 26.22 kg/m. 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  HEENT: normocephalic atraumatic , Eyes: PERRL EOM's full, conjunctiva clear, Nares: paten,t no deformity discharge or tenderness.,  Ears: no deformity EAC's clear TMs with normal landmarks. Mouth: clear OP, no lesions, edema.  Moist mucous membranes. Dentition in adequate repair. NECK: supple without masses bruits. CHEST/PULM:  Clear to auscultation and percussion breath sounds equal no wheeze , rales or rhonchi. No chest wall deformities or tenderness. Breast: normal by inspection . No dimpling, discharge, masses, tenderness or discharge . CV: PMI is nondisplaced, S1 S2 no gallops, murmurs, rubs. Peripheral pulses are full without delay. ABDOMEN: Bowel sounds normal nontender  No guard or rebound, no hepato splenomegal no CVA tenderness.   Extremtities:  No clubbing cyanosis or edema, no acute joint swelling or redness no focal atrophy NEURO:  Oriented x3, cranial nerves 3-12 appear to be intact,flat footed gait ( no change)  SKIN: No acute rashes normal turgor, color, no bruising or petechiae. Pink areas around neck ( momsays get srahs if doesn't exercise )  mildy thinning hair  Interactive  answers ?s  mom fills in history . LN: no cervical axillary inguinal adenopathy  Lab Results  Component Value Date   WBC 10.8 01/25/2023   HGB 16.1 (H) 01/25/2023   HCT 46.2 (H) 01/25/2023   PLT 347 01/25/2023   GLUCOSE 88 01/25/2023   CHOL 162 01/25/2023   TRIG 207.0 (H) 01/25/2023   HDL 50.40 01/25/2023   LDLDIRECT 84.0 01/25/2023   LDLCALC 71 08/12/2021   ALT 22 01/25/2023   AST 19 01/25/2023   NA 135 01/25/2023   K 4.0 01/25/2023   CL 100 01/25/2023   CREATININE 0.68 01/25/2023   BUN 8 01/25/2023   CO2 30 01/25/2023   TSH 2.34 01/25/2023    HGBA1C 4.3 (L) 01/25/2023    BP Readings from Last 3 Encounters:  01/25/23 110/76  08/18/22 108/78  08/06/22 106/88   Had oatmeal  bk fast nf today Lab results reviewed with patient  Past hx of ab [place]  saw Dr Orvan Falconer  Gi eval  decline endoscopy and never got the  h pylori  tests  ASSESSMENT AND PLAN:  Discussed the following assessment and plan:    ICD-10-CM   1. Visit for preventive health examination  Z00.00 Basic metabolic panel    CBC with Differential/Platelet    Hemoglobin A1c    Hepatic function panel    Lipid panel    TSH    T4, free    Vitamin D, 25-hydroxy    Iron, TIBC and Ferritin Panel    Vitamin B12    2. Medication management  Z79.899 Basic metabolic panel    CBC with Differential/Platelet    Hemoglobin A1c    Hepatic function panel    Lipid panel    TSH    T4, free    Vitamin D, 25-hydroxy    Iron, TIBC and Ferritin Panel    Vitamin B12    3. Developmental delay disorder  R62.50 Basic metabolic panel    CBC with Differential/Platelet    Hemoglobin A1c    Hepatic function panel    Lipid panel    TSH    T4, free    Vitamin D, 25-hydroxy    Iron, TIBC and Ferritin Panel    Vitamin B12    4. Hyperinsulinemia  E16.1 Basic metabolic panel    CBC with Differential/Platelet    Hemoglobin A1c    Hepatic function panel    Lipid panel    TSH    T4, free    Vitamin D, 25-hydroxy    Iron, TIBC and Ferritin Panel  Vitamin B12    5. Thyroiditis, autoimmune ?  E06.3 Basic metabolic panel    CBC with Differential/Platelet    Hemoglobin A1c    Hepatic function panel    Lipid panel    TSH    T4, free    Vitamin D, 25-hydroxy    Iron, TIBC and Ferritin Panel    Vitamin B12    6. Dyspepsia  R10.13 Basic metabolic panel    CBC with Differential/Platelet    Hemoglobin A1c    Hepatic function panel    Lipid panel    TSH    T4, free    Vitamin D, 25-hydroxy    Iron, TIBC and Ferritin Panel    Vitamin B12   post prandial upper ab pain   uncertain cause trial off metformin    7. Fracture  T14.8XXA Basic metabolic panel    CBC with Differential/Platelet    Hemoglobin A1c    Hepatic function panel    Lipid panel    TSH    T4, free    Vitamin D, 25-hydroxy    Iron, TIBC and Ferritin Panel    Vitamin B12    8. Abdominal pain, unspecified abdominal location  R10.9 POC Urinalysis Dipstick    Urine Culture    Urine Culture    Incomplete   eval in past for the abd pain but  had gotten some better . Trial off metformin anda bitd for 2 weeks and see how goes  Reconsider trying the h pylori stool test.  Make sure adequate vit D  Fu 2 mos or as needed.  See how mood and outlook is at that time  Return in about 2 months (around 03/27/2023) for depending on results  see how stomach is doing .  Patient Care Team: Hadley Detloff, Neta Mends, MD as PCP - General (Internal Medicine) Talmage Coin, MD as Attending Physician (Endocrinology) Patient Instructions  Checking vit d level Consider taking womens vitamins that has vit d in it (equivalent of 1000 iu per day.vit d3 ) It was advised to check for H pylori infection  a few yeasrs ago  with Dr Orvan Falconer with stool test ( or otherwise endoscopy)  this infection can cause ulcers and  gets better with   some antibiotic treatment.  I can reorder this test but you have to get it collected .   You could consider counseling for depression sx but I think getting better from the fracture and getting around  may help a good bit.   For now try off the metformin again for about 2 weeks   Can try pepcid otc  20 mg twice a day  for 2 weeks and then  see how doing.       Neta Mends. Omarr Hann M.D.

## 2023-01-25 ENCOUNTER — Encounter: Payer: Self-pay | Admitting: Internal Medicine

## 2023-01-25 ENCOUNTER — Other Ambulatory Visit: Payer: BC Managed Care – PPO

## 2023-01-25 ENCOUNTER — Ambulatory Visit (INDEPENDENT_AMBULATORY_CARE_PROVIDER_SITE_OTHER): Payer: BC Managed Care – PPO | Admitting: Internal Medicine

## 2023-01-25 VITALS — BP 110/76 | HR 73 | Temp 97.8°F | Ht 63.0 in | Wt 148.0 lb

## 2023-01-25 DIAGNOSIS — Z0000 Encounter for general adult medical examination without abnormal findings: Secondary | ICD-10-CM

## 2023-01-25 DIAGNOSIS — R6250 Unspecified lack of expected normal physiological development in childhood: Secondary | ICD-10-CM

## 2023-01-25 DIAGNOSIS — R1013 Epigastric pain: Secondary | ICD-10-CM

## 2023-01-25 DIAGNOSIS — T148XXA Other injury of unspecified body region, initial encounter: Secondary | ICD-10-CM

## 2023-01-25 DIAGNOSIS — R109 Unspecified abdominal pain: Secondary | ICD-10-CM

## 2023-01-25 DIAGNOSIS — Z0001 Encounter for general adult medical examination with abnormal findings: Secondary | ICD-10-CM | POA: Diagnosis not present

## 2023-01-25 DIAGNOSIS — Z79899 Other long term (current) drug therapy: Secondary | ICD-10-CM

## 2023-01-25 DIAGNOSIS — E785 Hyperlipidemia, unspecified: Secondary | ICD-10-CM | POA: Diagnosis not present

## 2023-01-25 DIAGNOSIS — E161 Other hypoglycemia: Secondary | ICD-10-CM

## 2023-01-25 DIAGNOSIS — E063 Autoimmune thyroiditis: Secondary | ICD-10-CM | POA: Diagnosis not present

## 2023-01-25 DIAGNOSIS — S82899D Other fracture of unspecified lower leg, subsequent encounter for closed fracture with routine healing: Secondary | ICD-10-CM | POA: Diagnosis not present

## 2023-01-25 LAB — BASIC METABOLIC PANEL
BUN: 8 mg/dL (ref 6–23)
CO2: 30 mEq/L (ref 19–32)
Calcium: 9.9 mg/dL (ref 8.4–10.5)
Chloride: 100 mEq/L (ref 96–112)
Creatinine, Ser: 0.68 mg/dL (ref 0.40–1.20)
GFR: 115.61 mL/min (ref >60.00)
Glucose, Bld: 88 mg/dL (ref 70–99)
Potassium: 4 mEq/L (ref 3.5–5.1)
Sodium: 135 mEq/L (ref 135–145)

## 2023-01-25 LAB — CBC WITH DIFFERENTIAL/PLATELET
Absolute Monocytes: 648 cells/uL (ref 200–950)
Basophils Absolute: 54 cells/uL (ref 0–200)
Basophils Relative: 0.5 %
Eosinophils Absolute: 119 cells/uL (ref 15–500)
Eosinophils Relative: 1.1 %
HCT: 46.2 % — ABNORMAL HIGH (ref 35.0–45.0)
Hemoglobin: 16.1 g/dL — ABNORMAL HIGH (ref 11.7–15.5)
Lymphs Abs: 3283.2 cells/uL (ref 850–3900)
MCH: 30.4 pg (ref 27.0–33.0)
MCHC: 34.8 g/dL (ref 32.0–36.0)
MCV: 87.3 fL (ref 80.0–100.0)
MPV: 12 fL (ref 7.5–12.5)
Monocytes Relative: 6 %
Neutro Abs: 6696 cells/uL (ref 1500–7800)
Neutrophils Relative %: 62 %
Platelets: 347 10*3/uL (ref 140–400)
RBC: 5.29 10*6/uL — ABNORMAL HIGH (ref 3.80–5.10)
RDW: 13.3 % (ref 11.0–15.0)
Total Lymphocyte: 30.4 %
WBC: 10.8 10*3/uL (ref 3.8–10.8)

## 2023-01-25 LAB — TSH: TSH: 2.34 u[IU]/mL (ref 0.35–5.50)

## 2023-01-25 LAB — VITAMIN B12: Vitamin B-12: 1392 pg/mL — ABNORMAL HIGH (ref 211–911)

## 2023-01-25 LAB — POCT URINALYSIS DIPSTICK
Bilirubin, UA: NEGATIVE
Blood, UA: NEGATIVE
Glucose, UA: NEGATIVE
Ketones, UA: NEGATIVE
Nitrite, UA: NEGATIVE
Protein, UA: NEGATIVE
Spec Grav, UA: 1.01 (ref 1.010–1.025)
Urobilinogen, UA: 0.2 E.U./dL
pH, UA: 6.5 (ref 5.0–8.0)

## 2023-01-25 LAB — LIPID PANEL
Cholesterol: 162 mg/dL (ref 0–200)
HDL: 50.4 mg/dL (ref >39.00)
NonHDL: 111.4
Total CHOL/HDL Ratio: 3
Triglycerides: 207 mg/dL — ABNORMAL HIGH (ref 0.0–149.0)
VLDL: 41.4 mg/dL — ABNORMAL HIGH (ref 0.0–40.0)

## 2023-01-25 LAB — HEPATIC FUNCTION PANEL
ALT: 22 U/L (ref 0–35)
AST: 19 U/L (ref 0–37)
Albumin: 4.6 g/dL (ref 3.5–5.2)
Alkaline Phosphatase: 80 U/L (ref 39–117)
Bilirubin, Direct: 0.1 mg/dL (ref 0.0–0.3)
Total Bilirubin: 0.6 mg/dL (ref 0.2–1.2)
Total Protein: 8.8 g/dL — ABNORMAL HIGH (ref 6.0–8.3)

## 2023-01-25 LAB — LDL CHOLESTEROL, DIRECT: Direct LDL: 84 mg/dL

## 2023-01-25 LAB — T4, FREE: Free T4: 1.06 ng/dL (ref 0.60–1.60)

## 2023-01-25 LAB — VITAMIN D 25 HYDROXY (VIT D DEFICIENCY, FRACTURES): VITD: 30.71 ng/mL (ref 30.00–100.00)

## 2023-01-25 LAB — HEMOGLOBIN A1C: Hgb A1c MFr Bld: 4.3 % — ABNORMAL LOW (ref 4.6–6.5)

## 2023-01-25 NOTE — Patient Instructions (Addendum)
Checking vit d level Consider taking womens vitamins that has vit d in it (equivalent of 1000 iu per day.vit d3 ) It was advised to check for H pylori infection  a few yeasrs ago  with Dr Elizabeth Cantrell with stool test ( or otherwise endoscopy)  this infection can cause ulcers and  gets better with   some antibiotic treatment.  I can reorder this test but you have to get it collected .   You could consider counseling for depression sx but I think getting better from the fracture and getting around  may help a good bit.   For now try off the metformin again for about 2 weeks   Can try pepcid otc  20 mg twice a day  for 2 weeks and then  see how doing.

## 2023-01-26 LAB — IRON,TIBC AND FERRITIN PANEL
%SAT: 24 % (calc) (ref 16–45)
Ferritin: 51 ng/mL (ref 16–154)
Iron: 70 ug/dL (ref 40–190)
TIBC: 292 mcg/dL (calc) (ref 250–450)

## 2023-01-26 LAB — URINE CULTURE
MICRO NUMBER:: 14771965
SPECIMEN QUALITY:: ADEQUATE

## 2023-01-27 NOTE — Progress Notes (Signed)
Blood sugar is ok  no  uti  B12 level is high so can back off on how much taking    Thyroid in range  No new advice

## 2023-01-28 DIAGNOSIS — S8261XA Displaced fracture of lateral malleolus of right fibula, initial encounter for closed fracture: Secondary | ICD-10-CM | POA: Diagnosis not present

## 2023-02-20 ENCOUNTER — Other Ambulatory Visit: Payer: Self-pay | Admitting: Internal Medicine

## 2023-02-24 ENCOUNTER — Telehealth: Payer: Self-pay | Admitting: Internal Medicine

## 2023-02-24 NOTE — Telephone Encounter (Signed)
Pts mother called to say that the other provider is retiring and so she needs a new pcp

## 2023-02-24 NOTE — Telephone Encounter (Signed)
Noted. Pt already scheduled 03/08/2023 at 10:20 am

## 2023-03-08 ENCOUNTER — Encounter: Payer: Self-pay | Admitting: Family Medicine

## 2023-03-08 ENCOUNTER — Ambulatory Visit (INDEPENDENT_AMBULATORY_CARE_PROVIDER_SITE_OTHER): Payer: BC Managed Care – PPO | Admitting: Family Medicine

## 2023-03-08 VITALS — BP 118/72 | HR 90 | Temp 97.7°F | Ht 65.75 in | Wt 144.4 lb

## 2023-03-08 DIAGNOSIS — R1013 Epigastric pain: Secondary | ICD-10-CM | POA: Diagnosis not present

## 2023-03-08 DIAGNOSIS — R6250 Unspecified lack of expected normal physiological development in childhood: Secondary | ICD-10-CM

## 2023-03-08 DIAGNOSIS — L74510 Primary focal hyperhidrosis, axilla: Secondary | ICD-10-CM

## 2023-03-08 MED ORDER — ESOMEPRAZOLE MAGNESIUM 20 MG PO TBEC
1.0000 | DELAYED_RELEASE_TABLET | Freq: Every day | ORAL | 1 refills | Status: DC
Start: 2023-03-08 — End: 2024-04-23

## 2023-03-08 MED ORDER — DRYSOL 20 % EX SOLN
Freq: Every day | CUTANEOUS | 0 refills | Status: AC
Start: 2023-03-08 — End: ?

## 2023-03-08 NOTE — Progress Notes (Signed)
Assessment/Plan:   Problem List Items Addressed This Visit       Musculoskeletal and Integument   Hyperhidrosis of axilla - Primary    Patient presents with complaints of excessive sweating and a strong odor under the arms, worse with heat and sweating. Over-the-counter antiperspirants have been ineffective.  Plan:  Prescribe aluminum chloride (DRYSOL) 20% external solution. Patient to follow up if local irritation occurs or if symptoms persist.      Relevant Medications   aluminum chloride (DRYSOL) 20 % external solution     Other   Developmental delay disorder (Chronic)   Dyspepsia    Patient experiences ongoing indigestion, bloating, burping, unrelated to meals. Previous relief with probiotics noted.  Differential diagnosis:  GERD: Symptoms consistent with gastroesophageal reflux. Functional dyspepsia: Possible given symptom chronicity and association with bloating. H. pylori infection: Less likely   Plan:  Initiate esomeprazole magnesium 20 mg TBEC. Allows patient to have them in conduit such as apple sauce, which may aid in adherence given developmental delay and intellectual disability Consider H. pylori testing if symptoms persist. Follow-up in 2 months to assess response to therapy and adjust treatment as needed.      Relevant Medications   Esomeprazole Magnesium 20 MG TBEC    Medications Discontinued During This Encounter  Medication Reason   ramelteon (ROZEREM) 8 MG tablet     Return in about 2 months (around 05/07/2023) for Abdominal discomfort.    Subjective:   Encounter date: 03/08/2023  Taylah Pasco is a 32 y.o. female who has Prediabetes; Fatigue; Anemia; Developmental delay disorder; Acanthosis nigricans; Tachycardia; ANA positive; Thoracic scoliosis; Goiter; Hair thinning; Acne; Iron deficiency; Visit for preventive health examination; PCOS (polycystic ovarian syndrome); Thyroiditis, autoimmune ?; Sleepiness; Sleep trouble; Medication management;  Hyperinsulinemia; Developmental delay; Hyperhidrosis of axilla; and Dyspepsia on their problem list..   She  has a past medical history of Acne, Acquired acanthosis nigricans, Amenorrhea, ANA positive, Asthma (08/03/2020), Developmental delay, Fractured elbow, GERD (gastroesophageal reflux disease), Heavy periods, Hirsutism, Hyperinsulinism, Hypoglycemia, Nosebleed, Seizure disorder (HCC), Seizures (HCC), Striae, Tachycardia, and Thoracic scoliosis..   CC: Patient is a 32 year old female who presents with a rash and odor under the arms.  HPI:  Rash. The patient developed a rash on her chest following a hip fracture in January. The rash initially improved but has persisted with a foul odor. The primary concern is that the odor might be related to mold or yeast. Dr. Fabian Sharp previously recommended a cream, but it has not been effective. The rash occasionally worsens with sweating and lying on her back due to limited mobility.  Odor. The patient's underarm odor has increased significantly, especially with sweating and hot weather. There is no associated pain, discharge, or vaginal symptoms. She does not respond well to mild deodorants used previously. There are no signs of infection or abscess upon examination.  GERD. The patient has a history of bloating, burping, and abdominal discomfort, which is alleviated somewhat by a heating pad. The symptoms are not meal-dependent. She has not taken any medication for heartburn but finds some relief from probiotics. There are no symptoms of vomiting or diarrhea, although there is occasional gastrointestinal discomfort that she is unable to fully articulate due to her developmental delay.  Review of Systems  Constitutional:  Negative for chills, diaphoresis, fever, malaise/fatigue and weight loss.  HENT:  Negative for congestion, ear discharge, ear pain and hearing loss.   Eyes:  Negative for blurred vision, double vision, photophobia, pain, discharge and redness.  Respiratory:  Negative for cough, sputum production, shortness of breath and wheezing.   Cardiovascular:  Negative for chest pain and palpitations.  Gastrointestinal:  Positive for abdominal pain (with bloating) and heartburn. Negative for blood in stool, constipation, diarrhea, melena, nausea and vomiting.  Genitourinary:  Negative for dysuria, flank pain, frequency, hematuria and urgency.  Musculoskeletal:  Negative for myalgias.  Skin:  Negative for itching and rash.  Neurological:  Negative for dizziness, tingling, tremors, speech change, seizures, loss of consciousness, weakness and headaches.  Endo/Heme/Allergies:  Negative for environmental allergies.  Psychiatric/Behavioral:  Negative for depression, hallucinations, memory loss, substance abuse and suicidal ideas. The patient does not have insomnia.   All other systems reviewed and are negative.   History reviewed. No pertinent surgical history.  Outpatient Medications Prior to Visit  Medication Sig Dispense Refill   Coenzyme Q10 (COQ10 PO) Take by mouth.     metFORMIN (GLUCOPHAGE-XR) 750 MG 24 hr tablet TAKE ONE TABLET BY MOUTH TWICE DAILY 180 tablet 0   Methylcobalamin (B12-ACTIVE PO) Take by mouth.     Probiotic Product (ALIGN PO) Take 1 capsule by mouth daily.     albuterol (VENTOLIN HFA) 108 (90 Base) MCG/ACT inhaler Inhale 2 puffs into the lungs every 6 (six) hours as needed for wheezing or shortness of breath. (Patient not taking: Reported on 01/25/2023) 18 g 0   bifidobacterium infantis (ALIGN) capsule Take 1 capsule by mouth daily. (Patient not taking: Reported on 01/25/2023) 30 capsule 5   Biotin 1000 MCG tablet Take 1,000 mcg by mouth daily. (Patient not taking: Reported on 01/25/2023)     cyanocobalamin 100 MCG tablet Take 100 mcg by mouth daily. (Patient not taking: Reported on 01/25/2023)     Spacer/Aero-Hold Chamber Mask MISC Inhale 1 Device into the lungs 4 (four) times daily as needed. To use with inhaler. (Patient not  taking: Reported on 01/25/2023) 1 Device 0   ramelteon (ROZEREM) 8 MG tablet Take 1 tablet (8 mg total) by mouth at bedtime. (Patient not taking: Reported on 01/25/2023) 30 tablet 1   No facility-administered medications prior to visit.    Family History  Problem Relation Age of Onset   Diabetes Mother    Hypertension Mother    Hyperlipidemia Other        parent grandparent    Social History   Socioeconomic History   Marital status: Single    Spouse name: Not on file   Number of children: Not on file   Years of education: 84   Highest education level: 12th grade  Occupational History   Not on file  Tobacco Use   Smoking status: Never    Passive exposure: Never   Smokeless tobacco: Never  Vaping Use   Vaping Use: Never used  Substance and Sexual Activity   Alcohol use: No   Drug use: No   Sexual activity: Never  Other Topics Concern   Not on file  Social History Narrative   hh of  3   [employer]  [employer] [place] gets    Neg tad   6 hours sleep gets speech therapy at school ( no ot pt)   Patient lives at home with her parents.   Caffeine Use: none   No pets    Social Determinants of Health   Financial Resource Strain: Low Risk  (01/18/2023)   Overall Financial Resource Strain (CARDIA)    Difficulty of Paying Living Expenses: Not hard at all  Food Insecurity: No Food Insecurity (01/18/2023)  Hunger Vital Sign    Worried About Running Out of Food in the Last Year: Never true    Ran Out of Food in the Last Year: Never true  Transportation Needs: No Transportation Needs (01/18/2023)   PRAPARE - Administrator, Civil Service (Medical): No    Lack of Transportation (Non-Medical): No  Physical Activity: Insufficiently Active (01/18/2023)   Exercise Vital Sign    Days of Exercise per Week: 1 day    Minutes of Exercise per Session: 20 min  Stress: No Stress Concern Present (01/18/2023)   [employer] of Occupational Health - Occupational Stress  Questionnaire    Feeling of Stress : Only a little  Social Connections: Unknown (01/18/2023)   Social Connection and Isolation Panel [NHANES]    Frequency of Communication with Friends and Family: Once a week    Frequency of Social Gatherings with Friends and Family: Never    Attends Religious Services: Never    Diplomatic Services operational officer: No    Attends Engineer, structural: Not on file    Marital Status: Patient declined  Catering manager Violence: Not on file                                                                                                  Objective:  Physical Exam: BP 118/72 (BP Location: Left Arm, Patient Position: Sitting, Cuff Size: Large)   Pulse 90   Temp 97.7 F (36.5 C) (Temporal)   Ht 5' 5.75" (1.67 m)   Wt 144 lb 6.4 oz (65.5 kg)   SpO2 99%   BMI 23.48 kg/m     Physical Exam Constitutional:      General: She is not in acute distress.    Appearance: Normal appearance. She is not ill-appearing or toxic-appearing.  HENT:     Head: Normocephalic and atraumatic.     Nose: Nose normal. No congestion.  Eyes:     General: No scleral icterus.    Extraocular Movements: Extraocular movements intact.  Cardiovascular:     Rate and Rhythm: Normal rate and regular rhythm.     Pulses: Normal pulses.     Heart sounds: Normal heart sounds.  Pulmonary:     Effort: Pulmonary effort is normal. No respiratory distress.     Breath sounds: Normal breath sounds.  Abdominal:     General: Abdomen is flat. Bowel sounds are normal.     Palpations: Abdomen is soft.  Musculoskeletal:        General: Normal range of motion.  Lymphadenopathy:     Cervical: No cervical adenopathy.  Skin:    General: Skin is warm and dry.     Findings: No rash.  Neurological:     General: No focal deficit present.     Mental Status: She is alert and oriented to person, place, and time. Mental status is at baseline.  Psychiatric:        Mood and Affect: Mood  normal.        Behavior: Behavior normal.        Thought Content:  Thought content normal.        Judgment: Judgment normal.     No results found.  Recent Results (from the past 2160 hour(s))  Basic metabolic panel     Status: None   Collection Time: 01/25/23 11:40 AM  Result Value Ref Range   Sodium 135 135 - 145 mEq/L   Potassium 4.0 3.5 - 5.1 mEq/L   Chloride 100 96 - 112 mEq/L   CO2 30 19 - 32 mEq/L   Glucose, Bld 88 70 - 99 mg/dL   BUN 8 6 - 23 mg/dL   Creatinine, Ser 9.14 0.40 - 1.20 mg/dL   GFR 782.95 >62.13 mL/min    Comment: Calculated using the CKD-EPI Creatinine Equation (2021)   Calcium 9.9 8.4 - 10.5 mg/dL  Hemoglobin Y8M     Status: Abnormal   Collection Time: 01/25/23 11:40 AM  Result Value Ref Range   Hgb A1c MFr Bld 4.3 (L) 4.6 - 6.5 %    Comment: Glycemic Control Guidelines for People with Diabetes:Non Diabetic:  <6%Goal of Therapy: <7%Additional Action Suggested:  >8%   Hepatic function panel     Status: Abnormal   Collection Time: 01/25/23 11:40 AM  Result Value Ref Range   Total Bilirubin 0.6 0.2 - 1.2 mg/dL   Bilirubin, Direct 0.1 0.0 - 0.3 mg/dL   Alkaline Phosphatase 80 39 - 117 U/L   AST 19 0 - 37 U/L   ALT 22 0 - 35 U/L   Total Protein 8.8 (H) 6.0 - 8.3 g/dL   Albumin 4.6 3.5 - 5.2 g/dL  Lipid panel     Status: Abnormal   Collection Time: 01/25/23 11:40 AM  Result Value Ref Range   Cholesterol 162 0 - 200 mg/dL    Comment: ATP III Classification       Desirable:  < 200 mg/dL               Borderline High:  200 - 239 mg/dL          High:  > = 578 mg/dL   Triglycerides 469.6 (H) 0.0 - 149.0 mg/dL    Comment: Normal:  <295 mg/dLBorderline High:  150 - 199 mg/dL   HDL 28.41 >32.44 mg/dL   VLDL 01.0 (H) 0.0 - 27.2 mg/dL   Total CHOL/HDL Ratio 3     Comment:                Men          Women1/2 Average Risk     3.4          3.3Average Risk          5.0          4.42X Average Risk          9.6          7.13X Average Risk          15.0          11.0                        NonHDL 111.40     Comment: NOTE:  Non-HDL goal should be 30 mg/dL higher than patient's LDL goal (i.e. LDL goal of < 70 mg/dL, would have non-HDL goal of < 100 mg/dL)  TSH     Status: None   Collection Time: 01/25/23 11:40 AM  Result Value Ref Range   TSH 2.34 0.35 - 5.50 uIU/mL  T4, free  Status: None   Collection Time: 01/25/23 11:40 AM  Result Value Ref Range   Free T4 1.06 0.60 - 1.60 ng/dL    Comment: Specimens from patients who are undergoing biotin therapy and /or ingesting biotin supplements may contain high levels of biotin.  The higher biotin concentration in these specimens interferes with this Free T4 assay.  Specimens that contain high levels  of biotin may cause false high results for this Free T4 assay.  Please interpret results in light of the total clinical presentation of the patient.    Vitamin D, 25-hydroxy     Status: None   Collection Time: 01/25/23 11:40 AM  Result Value Ref Range   VITD 30.71 30.00 - 100.00 ng/mL  Iron, TIBC and Ferritin Panel     Status: None   Collection Time: 01/25/23 11:40 AM  Result Value Ref Range   Iron 70 40 - 190 mcg/dL   TIBC 191 478 - 295 mcg/dL (calc)   %SAT 24 16 - 45 % (calc)   Ferritin 51 16 - 154 ng/mL  Vitamin B12     Status: Abnormal   Collection Time: 01/25/23 11:40 AM  Result Value Ref Range   Vitamin B-12 1,392 (H) 211 - 911 pg/mL  LDL cholesterol, direct     Status: None   Collection Time: 01/25/23 11:40 AM  Result Value Ref Range   Direct LDL 84.0 mg/dL    Comment: Optimal:  <621 mg/dLNear or Above Optimal:  100-129 mg/dLBorderline High:  130-159 mg/dLHigh:  160-189 mg/dLVery High:  >190 mg/dL  POC Urinalysis Dipstick     Status: Abnormal   Collection Time: 01/25/23 11:40 AM  Result Value Ref Range   Color, UA yellow    Clarity, UA cloudy    Glucose, UA Negative Negative   Bilirubin, UA Negative    Ketones, UA Negative    Spec Grav, UA 1.010 1.010 - 1.025   Blood, UA Negative    pH, UA  6.5 5.0 - 8.0   Protein, UA Negative Negative   Urobilinogen, UA 0.2 0.2 or 1.0 E.U./dL   Nitrite, UA Negative    Leukocytes, UA Large (3+) (A) Negative    Comment: 500 leu/uL   Appearance     Odor    Urine Culture     Status: None   Collection Time: 01/25/23  3:14 PM   Specimen: Urine  Result Value Ref Range   MICRO NUMBER: [id number]    SPECIMEN QUALITY: Adequate    Sample Source NOT GIVEN    STATUS: FINAL    Result:      Mixed genital flora isolated. These superficial bacteria are not indicative of a urinary tract infection. No further organism identification is warranted on this specimen. If clinically indicated, recollect clean-catch, mid-stream urine and transfer  immediately to Urine Culture Transport Tube.   CBC with Differential     Status: Abnormal   Collection Time: 01/25/23  3:59 PM  Result Value Ref Range   WBC 10.8 3.8 - 10.8 Thousand/uL   RBC 5.29 (H) 3.80 - 5.10 Million/uL   Hemoglobin 16.1 (H) 11.7 - 15.5 g/dL   HCT 69.6 (H) 29.5 - 28.4 %   MCV 87.3 80.0 - 100.0 fL   MCH 30.4 27.0 - 33.0 pg   MCHC 34.8 32.0 - 36.0 g/dL   RDW 13.2 44.0 - 10.2 %   Platelets 347 140 - 400 Thousand/uL   MPV 12.0 7.5 - 12.5 fL   Neutro Abs 6,696 1,500 -  7,800 cells/uL   Lymphs Abs 3,283.2 850 - 3,900 cells/uL   Absolute Monocytes 648 200 - 950 cells/uL   Eosinophils Absolute 119 15 - 500 cells/uL   Basophils Absolute 54 0 - 200 cells/uL   Neutrophils Relative % 62 %   Total Lymphocyte 30.4 %   Monocytes Relative 6.0 %   Eosinophils Relative 1.1 %   Basophils Relative 0.5 %        Garner Nash, MD, MS

## 2023-03-08 NOTE — Patient Instructions (Signed)
For excessive sweating in his arms, apply Drysol to the underarms nightly.  Follow-up as needed.   For heartburn related and abdominal symptoms, try the esomeprazole as prescribed.

## 2023-03-12 DIAGNOSIS — R1013 Epigastric pain: Secondary | ICD-10-CM | POA: Insufficient documentation

## 2023-03-12 DIAGNOSIS — L74510 Primary focal hyperhidrosis, axilla: Secondary | ICD-10-CM | POA: Insufficient documentation

## 2023-03-12 NOTE — Assessment & Plan Note (Signed)
Patient presents with complaints of excessive sweating and a strong odor under the arms, worse with heat and sweating. Over-the-counter antiperspirants have been ineffective.  Plan:  Prescribe aluminum chloride (DRYSOL) 20% external solution. Patient to follow up if local irritation occurs or if symptoms persist.

## 2023-03-12 NOTE — Assessment & Plan Note (Addendum)
Patient experiences ongoing indigestion, bloating, burping, unrelated to meals. Previous relief with probiotics noted.  Differential diagnosis:  GERD: Symptoms consistent with gastroesophageal reflux. Functional dyspepsia: Possible given symptom chronicity and association with bloating. H. pylori infection: Less likely   Plan:  Initiate esomeprazole magnesium 20 mg TBEC. Allows patient to have them in conduit such as apple sauce, which may aid in adherence given developmental delay and intellectual disability Consider H. pylori testing if symptoms persist. Follow-up in 2 months to assess response to therapy and adjust treatment as needed.

## 2023-03-16 ENCOUNTER — Ambulatory Visit: Payer: BC Managed Care – PPO | Admitting: Nurse Practitioner

## 2023-03-17 ENCOUNTER — Telehealth: Payer: Self-pay

## 2023-03-17 DIAGNOSIS — L74510 Primary focal hyperhidrosis, axilla: Secondary | ICD-10-CM

## 2023-03-17 NOTE — Telephone Encounter (Signed)
PA initiated via Covermymeds; KEY: [id number]. Awaiting determination.

## 2023-03-18 MED ORDER — GLYCOPYRRONIUM TOSYLATE 2.4 % EX PADS
MEDICATED_PAD | CUTANEOUS | 2 refills | Status: DC
Start: 2023-03-18 — End: 2024-04-23

## 2023-03-18 NOTE — Telephone Encounter (Signed)
Please advise, Drysol PA was denied, see below.

## 2023-03-18 NOTE — Addendum Note (Signed)
Addended by: Garnette Gunner on: 03/18/2023 06:49 PM   Modules accepted: Orders

## 2023-03-18 NOTE — Telephone Encounter (Signed)
PA denied.   The requested drug is supplied by a manufacturer or distributor that does not participate in the federal Medical Rebate Program. Drugs supplied by manufacturers or distributors that do not participate in this program are not covered under the Medical Assistance Program.

## 2023-03-25 ENCOUNTER — Telehealth: Payer: Self-pay

## 2023-03-25 NOTE — Telephone Encounter (Signed)
*  Primary  PA request received via CMM for Qbrexza 2.4% pads  PA submitted to [employer] and is pending determination via CMM  Key: [id number]

## 2023-03-26 DIAGNOSIS — Z419 Encounter for procedure for purposes other than remedying health state, unspecified: Secondary | ICD-10-CM | POA: Diagnosis not present

## 2023-04-04 NOTE — Telephone Encounter (Signed)
PA has been DENIED:   The requested drug is supplied by a manufacturer or distributor that does not participate in the federal Medical Rebate Program. Drugs supplied by manufacturers or distributors that do not participate in this program are not covered under the Medical Assistance Program

## 2023-04-07 NOTE — Telephone Encounter (Signed)
Left patient's family a detailed voice message with annotation below and to return call to office with any concerns

## 2023-04-25 DIAGNOSIS — Z419 Encounter for procedure for purposes other than remedying health state, unspecified: Secondary | ICD-10-CM | POA: Diagnosis not present

## 2023-04-29 ENCOUNTER — Encounter (INDEPENDENT_AMBULATORY_CARE_PROVIDER_SITE_OTHER): Payer: Self-pay

## 2023-05-26 DIAGNOSIS — Z419 Encounter for procedure for purposes other than remedying health state, unspecified: Secondary | ICD-10-CM | POA: Diagnosis not present

## 2023-06-26 DIAGNOSIS — Z419 Encounter for procedure for purposes other than remedying health state, unspecified: Secondary | ICD-10-CM | POA: Diagnosis not present

## 2023-07-18 ENCOUNTER — Telehealth: Payer: Self-pay | Admitting: Family Medicine

## 2023-07-18 NOTE — Telephone Encounter (Signed)
Pt mother is requesting TOC to [facility] due to proximity to their home. They love Dr. Janee Morn and felt he was very caring but need something closer to them.

## 2023-07-21 NOTE — Telephone Encounter (Signed)
Ok with me. Please place any necessary orders.

## 2023-07-21 NOTE — Telephone Encounter (Signed)
OK TOC

## 2023-07-26 DIAGNOSIS — Z419 Encounter for procedure for purposes other than remedying health state, unspecified: Secondary | ICD-10-CM | POA: Diagnosis not present

## 2023-08-05 NOTE — Telephone Encounter (Signed)
LVM to get pt scheduled

## 2023-08-15 ENCOUNTER — Ambulatory Visit: Payer: BC Managed Care – PPO | Admitting: Family Medicine

## 2023-08-23 ENCOUNTER — Ambulatory Visit (INDEPENDENT_AMBULATORY_CARE_PROVIDER_SITE_OTHER): Payer: BC Managed Care – PPO | Admitting: Family Medicine

## 2023-08-23 ENCOUNTER — Encounter: Payer: Self-pay | Admitting: Family Medicine

## 2023-08-23 VITALS — BP 115/72 | HR 75 | Temp 98.2°F | Ht 65.75 in | Wt 143.2 lb

## 2023-08-23 DIAGNOSIS — R7303 Prediabetes: Secondary | ICD-10-CM | POA: Diagnosis not present

## 2023-08-23 DIAGNOSIS — G479 Sleep disorder, unspecified: Secondary | ICD-10-CM

## 2023-08-23 DIAGNOSIS — R5383 Other fatigue: Secondary | ICD-10-CM

## 2023-08-23 DIAGNOSIS — Z9889 Other specified postprocedural states: Secondary | ICD-10-CM | POA: Insufficient documentation

## 2023-08-23 DIAGNOSIS — J45909 Unspecified asthma, uncomplicated: Secondary | ICD-10-CM | POA: Insufficient documentation

## 2023-08-23 DIAGNOSIS — R6250 Unspecified lack of expected normal physiological development in childhood: Secondary | ICD-10-CM

## 2023-08-23 DIAGNOSIS — E282 Polycystic ovarian syndrome: Secondary | ICD-10-CM

## 2023-08-23 DIAGNOSIS — H9319 Tinnitus, unspecified ear: Secondary | ICD-10-CM | POA: Diagnosis not present

## 2023-08-23 LAB — COMPREHENSIVE METABOLIC PANEL
ALT: 30 U/L (ref 0–35)
AST: 16 U/L (ref 0–37)
Albumin: 4.2 g/dL (ref 3.5–5.2)
Alkaline Phosphatase: 69 U/L (ref 39–117)
BUN: 10 mg/dL (ref 6–23)
CO2: 26 meq/L (ref 19–32)
Calcium: 9.4 mg/dL (ref 8.4–10.5)
Chloride: 102 meq/L (ref 96–112)
Creatinine, Ser: 0.64 mg/dL (ref 0.40–1.20)
GFR: 116.84 mL/min (ref >60.00)
Glucose, Bld: 113 mg/dL — ABNORMAL HIGH (ref 70–99)
Potassium: 4.2 meq/L (ref 3.5–5.1)
Sodium: 136 meq/L (ref 135–145)
Total Bilirubin: 0.8 mg/dL (ref 0.2–1.2)
Total Protein: 7.8 g/dL (ref 6.0–8.3)

## 2023-08-23 LAB — IBC + FERRITIN
Ferritin: 47.4 ng/mL (ref 10.0–291.0)
Iron: 111 ug/dL (ref 42–145)
Saturation Ratios: 37.9 % (ref 20.0–50.0)
TIBC: 292.6 ug/dL (ref 250.0–450.0)
Transferrin: 209 mg/dL — ABNORMAL LOW (ref 212.0–360.0)

## 2023-08-23 LAB — CBC
HCT: 40.7 % (ref 36.0–46.0)
Hemoglobin: 14.4 g/dL (ref 12.0–15.0)
MCHC: 35.4 g/dL (ref 30.0–36.0)
MCV: 85.1 fL (ref 78.0–100.0)
Platelets: 264 10*3/uL (ref 150.0–400.0)
RBC: 4.78 Mil/uL (ref 3.87–5.11)
RDW: 14.4 % (ref 11.5–15.5)
WBC: 11.6 10*3/uL — ABNORMAL HIGH (ref 4.0–10.5)

## 2023-08-23 LAB — CORTISOL: Cortisol, Plasma: 7.9 ug/dL

## 2023-08-23 LAB — VITAMIN B12: Vitamin B-12: 955 pg/mL — ABNORMAL HIGH (ref 211–911)

## 2023-08-23 LAB — VITAMIN D 25 HYDROXY (VIT D DEFICIENCY, FRACTURES): VITD: 27.59 ng/mL — ABNORMAL LOW (ref 30.00–100.00)

## 2023-08-23 LAB — TSH: TSH: 1.95 u[IU]/mL (ref 0.35–5.50)

## 2023-08-23 LAB — TESTOSTERONE: Testosterone: 27.29 ng/dL (ref 15.00–40.00)

## 2023-08-23 LAB — HEMOGLOBIN A1C: Hgb A1c MFr Bld: 4.3 % — ABNORMAL LOW (ref 4.6–6.5)

## 2023-08-23 NOTE — Assessment & Plan Note (Signed)
Last A1c was 4.8.  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  Will recheck A1c, C-peptide, and insulin level today.  If she continues to have low A1c and high insulin would consider referral to endocrinology for further evaluation.

## 2023-08-23 NOTE — Assessment & Plan Note (Signed)
Has followed with gynecology for this in the past.  She is currently on metformin for hyperinsulinemia however she has not currently on any other treatments for this.  Will check labs today including A1c and testosterone levels.

## 2023-08-23 NOTE — Assessment & Plan Note (Signed)
She is having more issues with sleep onset insomnia.  Will be referring for sleep study as above.  Depending on clinical progression and results of sleep study may consider trial of sleep aid as this is likely the main contributing for her ongoing issues with fatigue.

## 2023-08-23 NOTE — Assessment & Plan Note (Signed)
This has been going on for many years.  May be related to her childhood seizure disorder and developmental delay however symptoms are bothersome.  Will place referral to ENT.  No significant abnormalities noted on exam today.

## 2023-08-23 NOTE — Assessment & Plan Note (Signed)
Has been evaluated at [facility] for this as a child.  She did have a seizure disorder as well as a child which has since resolved.  Symptoms have been stable for many years.

## 2023-08-23 NOTE — Progress Notes (Signed)
Elizabeth Cantrell is a 32 y.o. female who presents today for an office visit.  She is here to transfer care.   Assessment/Plan:  Chronic Problems Addressed Today: Tinnitus This has been going on for many years.  May be related to her childhood seizure disorder and developmental delay however symptoms are bothersome.  Will place referral to ENT.  No significant abnormalities noted on exam today.  Fatigue Likely multifactorial.  She does have a history of PCOS and is not getting great quality sleep.  Due to daytime somnolence would be reasonable for [place] to obtain a sleep study to rule out OSA or other sleep disturbances.  Will place referral for this.  Will also check labs to rule out other metabolic causes today.  Prediabetes Last A1c was 4.8.  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  Will recheck A1c, C-peptide, and insulin level today.  If she continues to have low A1c and high insulin would consider referral to endocrinology for further evaluation.  Developmental delay disorder Has been evaluated at [facility] for this as a child.  She did have a seizure disorder as well as a child which has since resolved.  Symptoms have been stable for many years.  Asthma This is no longer an issue.  Has previously used albuterol but does not need this at this point.   Sleep trouble She is having more issues with sleep onset insomnia.  Will be referring for sleep study as above.  Depending on clinical progression and results of sleep study may consider trial of sleep aid as this is likely the main contributing for her ongoing issues with fatigue.  PCOS (polycystic ovarian syndrome) Has followed with gynecology for this in the past.  She is currently on metformin for hyperinsulinemia however she has not currently on any other treatments for this.  Will check labs today including A1c and testosterone levels.     Subjective:  HPI:  See A/P for status of chronic conditions.   Patient is here today to transfer care to [place] for primary care.  Her medical history is significant for developmental delay, prediabetes, GERD, and PCOS.  She is here with her mother today.  She has previously been diagnosed with asthma however this is no longer an issue.  She is currently taking metformin due to high insulin levels and concern for prediabetes.  She does follow with gynecology and had a endometrial ablation last year.  She also had seizures as a child however she has not had any recurrence for many years.  She is no longer on any antiepileptic medications.  Her main concern today is excessive fatigue.  This is going on for many years.  They have tried supplements over-the-counter without much improvement.  They do note that she is a poor sleeper.  Has a hard time falling asleep.  She can sleep for many hours however wake up and still feel fatigued.  Mother would like to have blood work done today.  She has been diagnosed with prediabetes in the past and has had issues with high insulin.  She has been compliant with her metformin 750 mg twice daily for this.  She does not snore.  They have no concerns for sleep apnea.  Patient is also concerned about ringing in her ears.  More on the right than the left.  No concerns for decreased hearing.  This also has been going on for many years.  She does note that  she is sensitive to certain high volumes and other sounds.  ROS: Per HPI, otherwise a complete review of systems was negative.   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 Testing for Rett Syndrome normal.   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 No pertinent surgical history.  Family History  Problem Relation Age of Onset   Diabetes Mother    Hypertension Mother    Hyperlipidemia Other        parent grandparent    Medications- reviewed and updated Current Outpatient Medications  Medication Sig Dispense Refill   aluminum chloride (DRYSOL) 20 % external solution Apply topically at bedtime. 60 mL 0   bifidobacterium infantis (ALIGN) capsule Take 1 capsule by mouth daily. 30 capsule 5   Biotin 1000 MCG tablet Take 1,000 mcg by mouth daily.     Coenzyme Q10 (COQ10 PO) Take by mouth.     cyanocobalamin 100 MCG tablet Take 100 mcg by mouth daily.     metFORMIN (GLUCOPHAGE-XR) 750 MG 24 hr tablet TAKE ONE TABLET BY MOUTH TWICE DAILY 180 tablet 0   Methylcobalamin (B12-ACTIVE PO) Take by mouth.     Probiotic Product (ALIGN PO) Take 1 capsule by mouth daily.     Spacer/Aero-Hold Chamber Mask MISC Inhale 1 Device into the lungs 4 (four) times daily as needed. To use with inhaler. 1 Device 0   Esomeprazole Magnesium 20 MG TBEC Take 1 each by mouth daily. Give 30 minutes before breakfast 30 tablet 1   Glycopyrronium Tosylate 2.4 % PADS Apply to underarms (Patient not taking: Reported on 08/23/2023) 30 each 2    No current facility-administered medications for this visit.    Allergies-reviewed and updated No Known Allergies  Social History   Socioeconomic History   Marital status: Single    Spouse name: Not on file   Number of children: Not on file   Years of education: 12   Highest education level: 12th grade  Occupational History   Not on file  Tobacco Use   Smoking status: Never    Passive exposure: Never   Smokeless tobacco: Never  Vaping Use   Vaping status: Never Used  Substance and Sexual Activity   Alcohol use: No   Drug use: No  Sexual activity: Never  Other Topics Concern   Not on file  Social History Narrative   hh of  3   [employer]  [employer] [place] gets    Neg tad   6 hours sleep gets speech therapy at school ( no ot pt)   Patient lives at home with her parents.   Caffeine Use: none   No pets    Social Determinants of Health   Financial Resource Strain: Low Risk  (01/18/2023)   Overall Financial Resource Strain (CARDIA)    Difficulty of Paying Living Expenses: Not hard at all  Food Insecurity: No Food Insecurity (01/18/2023)   Hunger Vital Sign    Worried About Running Out of Food in the Last Year: Never true    Ran Out of Food in the Last Year: Never true  Transportation Needs: No Transportation Needs (01/18/2023)   PRAPARE - Administrator, Civil Service (Medical): No    Lack of Transportation (Non-Medical): No  Physical Activity: Insufficiently Active (01/18/2023)   Exercise Vital Sign    Days of Exercise per Week: 1 day    Minutes of Exercise per Session: 20 min  Stress: No Stress Concern Present (01/18/2023)   [employer] of Occupational Health - Occupational Stress Questionnaire    Feeling of Stress : Only a little  Social Connections: Unknown (01/18/2023)   Social Connection and Isolation Panel [NHANES]    Frequency of Communication with Friends and Family: Once a week    Frequency of Social Gatherings with Friends and  Family: Never    Attends Religious Services: Never    Database administrator or Organizations: No    Attends Engineer, structural: Not on file    Marital Status: Patient declined          Objective:  Physical Exam: BP 115/72   Pulse 75   Temp 98.2 F (36.8 C) (Temporal)   Ht 5' 5.75" (1.67 m)   Wt 143 lb 3.2 oz (65 kg)   SpO2 99%   BMI 23.29 kg/m   Gen: No acute distress, resting comfortably HEENT: TMs clear bilaterally. CV: Regular rate and rhythm with no murmurs appreciated Pulm: Normal work of breathing, clear to auscultation bilaterally with no crackles, wheezes, or rhonchi Neuro: Grossly normal, moves all extremities Psych: Normal affect and thought content  Time Spent: 45 minutes of total time was spent on the date of the encounter performing the following actions: chart review prior to seeing the patient including recent visits with previous PCPs, obtaining history, performing a medically necessary exam, counseling on the treatment plan, placing orders, and documenting in our EHR.        Katina Degree. [name], MD 08/23/2023 10:49 AM

## 2023-08-23 NOTE — Assessment & Plan Note (Signed)
This is no longer an issue.  Has previously used albuterol but does not need this at this point.

## 2023-08-23 NOTE — Assessment & Plan Note (Signed)
Likely multifactorial.  She does have a history of PCOS and is not getting great quality sleep.  Due to daytime somnolence would be reasonable for [place] to obtain a sleep study to rule out OSA or other sleep disturbances.  Will place referral for this.  Will also check labs to rule out other metabolic causes today.

## 2023-08-23 NOTE — Patient Instructions (Signed)
It was very nice to see you today!  We will check blood work today.  We will refer you to see the ear doctor.  We will also order a sleep study.  Return in about 3 months (around 11/23/2023) for Follow Up.   Take care, Dr [name]  PLEASE NOTE:  If you had any lab tests, please let us know if you have not heard back within a few days. You may see your results on mychart before we have a chance to review them but we will give you a call once they are reviewed by [place].   If we ordered any referrals today, please let us know if you have not heard from their office within the next week.   If you had any urgent prescriptions sent in today, please check with the pharmacy within an hour of our visit to make sure the prescription was transmitted appropriately.   Please try these tips to maintain a healthy lifestyle:  Eat at least 3 REAL meals and 1-2 snacks per day.  Aim for no more than 5 hours between eating.  If you eat breakfast, please do so within one hour of getting up.   Each meal should contain half fruits/vegetables, one quarter protein, and one quarter carbs (no bigger than a computer mouse)  Cut down on sweet beverages. This includes juice, soda, and sweet tea.   Drink at least 1 glass of water with each meal and aim for at least 8 glasses per day  Exercise at least 150 minutes every week.

## 2023-08-24 LAB — C-PEPTIDE: C-Peptide: 3.47 ng/mL (ref 0.80–3.85)

## 2023-08-24 LAB — INSULIN, RANDOM: Insulin: 25 u[IU]/mL — ABNORMAL HIGH

## 2023-08-25 NOTE — Telephone Encounter (Signed)
Please advise 

## 2023-08-25 NOTE — Telephone Encounter (Signed)
Bone density screening is not indicated at this time.  Elizabeth Cantrell. [name], MD 08/25/2023 12:55 PM

## 2023-08-25 NOTE — Progress Notes (Signed)
Her labs show that she does have high insulin levels.  Recommend referral to endocrinology for further evaluation for this.  This could be causing some of her fatigue issues.  Vitamin D is a little low.  Recommend starting 1000 2000 IUs once daily and we can recheck in 3 to 6 months.  Her white blood cell count was also elevated.  I would like for her to come back in 1 to 2 weeks to recheck CBC with differential.  The rest of her labs are all stable.

## 2023-08-26 ENCOUNTER — Encounter (INDEPENDENT_AMBULATORY_CARE_PROVIDER_SITE_OTHER): Payer: Self-pay | Admitting: Otolaryngology

## 2023-08-26 ENCOUNTER — Other Ambulatory Visit: Payer: Self-pay | Admitting: *Deleted

## 2023-08-26 DIAGNOSIS — D72829 Elevated white blood cell count, unspecified: Secondary | ICD-10-CM

## 2023-08-26 DIAGNOSIS — Z419 Encounter for procedure for purposes other than remedying health state, unspecified: Secondary | ICD-10-CM | POA: Diagnosis not present

## 2023-08-26 DIAGNOSIS — R7989 Other specified abnormal findings of blood chemistry: Secondary | ICD-10-CM

## 2023-09-07 ENCOUNTER — Other Ambulatory Visit (INDEPENDENT_AMBULATORY_CARE_PROVIDER_SITE_OTHER): Payer: BC Managed Care – PPO

## 2023-09-07 DIAGNOSIS — D72829 Elevated white blood cell count, unspecified: Secondary | ICD-10-CM

## 2023-09-08 LAB — CBC WITH DIFFERENTIAL/PLATELET
Basophils Absolute: 0 10*3/uL (ref 0.0–0.1)
Basophils Relative: 0.5 % (ref 0.0–3.0)
Eosinophils Absolute: 0.1 10*3/uL (ref 0.0–0.7)
Eosinophils Relative: 0.9 % (ref 0.0–5.0)
HCT: 40.9 % (ref 36.0–46.0)
Hemoglobin: 14.8 g/dL (ref 12.0–15.0)
Lymphocytes Relative: 31.7 % (ref 12.0–46.0)
Lymphs Abs: 2.8 10*3/uL (ref 0.7–4.0)
MCHC: 36.1 g/dL — ABNORMAL HIGH (ref 30.0–36.0)
MCV: 84.1 fL (ref 78.0–100.0)
Monocytes Absolute: 0.4 10*3/uL (ref 0.1–1.0)
Monocytes Relative: 4.9 % (ref 3.0–12.0)
Neutro Abs: 5.4 10*3/uL (ref 1.4–7.7)
Neutrophils Relative %: 62 % (ref 43.0–77.0)
Platelets: 282 10*3/uL (ref 150.0–400.0)
RBC: 4.87 Mil/uL (ref 3.87–5.11)
RDW: 14.2 % (ref 11.5–15.5)
WBC: 8.7 10*3/uL (ref 4.0–10.5)

## 2023-09-12 NOTE — Progress Notes (Signed)
White  blood cell count was normal.  We can recheck again in a year.

## 2023-09-13 ENCOUNTER — Ambulatory Visit (INDEPENDENT_AMBULATORY_CARE_PROVIDER_SITE_OTHER): Payer: BC Managed Care – PPO | Admitting: Audiology

## 2023-09-13 ENCOUNTER — Ambulatory Visit (INDEPENDENT_AMBULATORY_CARE_PROVIDER_SITE_OTHER): Payer: BC Managed Care – PPO | Admitting: Physician Assistant

## 2023-09-13 DIAGNOSIS — H9313 Tinnitus, bilateral: Secondary | ICD-10-CM | POA: Diagnosis not present

## 2023-09-13 DIAGNOSIS — Z0110 Encounter for examination of ears and hearing without abnormal findings: Secondary | ICD-10-CM | POA: Diagnosis not present

## 2023-09-13 NOTE — Progress Notes (Signed)
  [address] [place], [place] [address] [phone]  Audiological Evaluation    Name: Elizabeth Cantrell     DOB:   29-Jan-1991      MRN:   [id number]                                                                                     Service Date: 09/13/2023        Patient was referred today for a hearing evaluation by Eyvonne Mechanic, PA-C.   Symptoms Yes Details  Hearing loss  [x]  Patient reported perceiving left ear hearing loss.  Balance problems    []   Patient denied vertigo/imbalance sensations.  Previous ear surgeries  []  Patient denied any previous ear surgeries.  Family history  []  Patient denied family history of hearing loss.  Amplification  []  Patient denied the use of hearing aids.    Tympanogram: Right ear: Normal external ear canal volume with normal middle ear pressure and low tympanic membrane compliance (Type As). Left ear: Normal external ear canal volume with normal middle ear pressure and tympanic membrane compliance (Type A).    Hearing Evaluation: The audiogram was completed using conventional audiometric techniques under headphones with good reliability.   The hearing test results indicate: Right ear: Normal hearing sensitivity from [phone] Hz. Left ear: Normal hearing sensitivity from [phone] Hz.  Speech Audiometry: Right ear- Speech Reception Threshold (SRT) was obtained at 5 dBHL. Left ear- Speech Reception Threshold (SRT) was obtained at 5 dBHL.   Word Recognition Score Tested using NU-6 (MLV) Right ear: 100% was obtained at a presentation level of 45 dBHL which is deemed as excellent understanding. Left ear: 100% was obtained at a presentation level of 45 dBHL which is deemed as excellent understanding.    Impression:  There is not a significant difference between puretone thresholds and word recognition scores between ears.   Recommendations: Repeat audiogram when changes are perceived or per MD.   Conley Rolls Carys Malina, AUD,  CCC-A  09/13/23

## 2023-09-13 NOTE — Progress Notes (Signed)
Dear Dr. [name], Here is my assessment for our mutual patient, Elizabeth Cantrell. Thank you for allowing me the opportunity to care for your patient. Please do not hesitate to contact me should you have any other questions. Sincerely, Burna Forts PA-C  Otolaryngology Clinic Note Referring provider: Dr. [name] HPI:  Elizabeth Cantrell is a 32 y.o. female kindly referred by Dr. [name] for evaluation of tinnitus.   The patient has a significant past medical history of development delay disorder, she is accompanied by her mother who helps with her history.  The patient describes aching in the bilateral ears, with the addition of ringing over the last year.  She notes this was symmetric symmetric but now is predominantly in the left ear, not generally present during the day, but is more pronounced at night and in quiet environments.  She has a history of insomnia and has been referred to neurology for this.  She notes the aching in her years comes and goes is not severe and is not bothersome.  She denies any significant hearing loss, drainage, history recurrent ear infections, head or neck surgery, history of cancer.  She denies any history of trauma or excessive noise exposure.  She notes that she is low on vitamin D and has been supplementing recently.  She had a hearing test when she was in middle school which was normal.  She has not had any recent changes in medication, she is not taking any ototoxic medications.   H&N Surgery: no Personal or FHx of bleeding dz or anesthesia difficulty: no    GLP-1: no AP/AC: no   Independent Review of Additional Tests or Records:  Audiogram 09/13/2023  Tympanogram: Right ear: (Type As). Left ear: (Type A).   The hearing test results indicate: Right ear: Normal hearing sensitivity from [phone] Hz. Left ear: Normal hearing sensitivity from [phone] Hz.   Speech Audiometry: Right ear- Speech Reception Threshold (SRT) was obtained at 5 dBHL. Left ear- Speech  Reception Threshold (SRT) was obtained at 5 dBHL.   Word Recognition Score Tested using NU-6 (MLV) Right ear: 100% was obtained at a presentation level of 45 dBHL which is deemed as excellent understanding. Left ear: 100% was obtained at a presentation level of 45 dBHL which is deemed as excellent understanding.   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 Testing for Rett Syndrome normal.   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  Family History  Problem Relation Age of Onset   Diabetes Mother    Hypertension Mother    Hyperlipidemia Other        parent grandparent  Social Connections: Unknown (01/18/2023)   Social Connection and Isolation Panel [NHANES]    Frequency of Communication with Friends and Family: Once a week    Frequency of Social Gatherings with Friends and Family: Never    Attends Religious Services: Never    Database administrator or Organizations: No    Attends Engineer, structural: Not on file    Marital Status: Patient declined      Current Outpatient Medications:    aluminum chloride (DRYSOL) 20 % external solution, Apply topically at bedtime., Disp: 60 mL, Rfl: 0   bifidobacterium infantis (ALIGN) capsule, Take 1 capsule by mouth daily., Disp: 30  capsule, Rfl: 5   Biotin 1000 MCG tablet, Take 1,000 mcg by mouth daily., Disp: , Rfl:    Coenzyme Q10 (COQ10 PO), Take by mouth., Disp: , Rfl:    cyanocobalamin 100 MCG tablet, Take 100 mcg by mouth daily., Disp: , Rfl:    Esomeprazole Magnesium 20 MG TBEC, Take 1 each by mouth daily. Give 30 minutes before breakfast, Disp: 30 tablet, Rfl: 1   Glycopyrronium Tosylate 2.4 % PADS, Apply to underarms (Patient not taking: Reported on 08/23/2023), Disp: 30 each, Rfl: 2   metFORMIN (GLUCOPHAGE-XR) 750 MG 24 hr tablet, TAKE ONE TABLET BY MOUTH TWICE DAILY, Disp: 180 tablet, Rfl: 0   Methylcobalamin (B12-ACTIVE PO), Take by mouth., Disp: , Rfl:    Probiotic Product (ALIGN PO), Take 1 capsule by mouth daily., Disp: , Rfl:    Spacer/Aero-Hold Chamber Mask MISC, Inhale 1 Device into the lungs 4 (four) times daily as needed. To use with inhaler., Disp: 1 Device, Rfl: 0   Physical Exam:   There were no vitals taken for this visit.  Pertinent Findings  CN II-XII intact  Bilateral EAC with minimal cerumen and TM intact with well pneumatized middle ear spaces Weber 512: equal Rinne 512: AC > BC b/l  Anterior rhinoscopy: Septum midline; bilateral inferior turbinates with no hypertrophy No lesions of oral cavity/oropharynx; dentition WNL No obviously palpable neck masses/lymphadenopathy/thyromegaly No respiratory distress or stridor 1+ tonislls No palatal clonus  Seprately Identifiable Procedures:  None  Impression & Plans:  [name] is a 32 y.o. female with tinnitus   Tinnitus-  This is a 32 year old female presenting today with bilateral tinnitus, this has progressed to be predominantly on the left.  Her symptoms do not appear to be consistent with vascular disorders, although this is more prominent on the left it was bilateral making neoplasm much less likely.  No reports of clicking or popping consistent with myoclonus of the palatal muscles, her description is not consistent with eustachian  tube dysfunction.  I reviewed her current medications, she is not taking any ototoxic medications, her audiogram was reviewed showing no significant hearing loss.  I did discuss with her mother on treatment options, she has no significant worrisome signs at this time, I did offer MRI brain/IAC to assure no lesions, she does not feel this is necessary at this time would like to hold off.  I think it is reasonable for her to have a watch and wait approach.  I would like them to follow-up in our clinic in 6 months for repeat evaluation, she can return sooner if she develops any new or worsening signs or symptoms.  She can use a white noise machine at night to help with the ringing.  Both the patient and her mother had no further questions or concerns, they verbalized understanding and agreement to today's  plan.     - f/u 6 months   Thank you for allowing me the opportunity to care for your patient. Please do not hesitate to contact me should you have any other questions.  Sincerely, Burna Forts PA-C [facility] Phone: [phone] Fax: [phone]  09/13/2023, 11:15 AM

## 2023-09-16 ENCOUNTER — Encounter (INDEPENDENT_AMBULATORY_CARE_PROVIDER_SITE_OTHER): Payer: Self-pay | Admitting: Physician Assistant

## 2023-09-25 DIAGNOSIS — Z419 Encounter for procedure for purposes other than remedying health state, unspecified: Secondary | ICD-10-CM | POA: Diagnosis not present

## 2023-10-26 DIAGNOSIS — Z419 Encounter for procedure for purposes other than remedying health state, unspecified: Secondary | ICD-10-CM | POA: Diagnosis not present

## 2023-11-24 ENCOUNTER — Ambulatory Visit: Payer: BC Managed Care – PPO | Admitting: Family Medicine

## 2023-11-26 DIAGNOSIS — Z419 Encounter for procedure for purposes other than remedying health state, unspecified: Secondary | ICD-10-CM | POA: Diagnosis not present

## 2023-12-02 ENCOUNTER — Ambulatory Visit (INDEPENDENT_AMBULATORY_CARE_PROVIDER_SITE_OTHER): Payer: 59 | Admitting: Family Medicine

## 2023-12-02 ENCOUNTER — Encounter: Payer: Self-pay | Admitting: Family Medicine

## 2023-12-02 VITALS — BP 102/69 | HR 93 | Temp 97.5°F | Ht 65.75 in | Wt 140.0 lb

## 2023-12-02 DIAGNOSIS — Z23 Encounter for immunization: Secondary | ICD-10-CM

## 2023-12-02 DIAGNOSIS — L658 Other specified nonscarring hair loss: Secondary | ICD-10-CM | POA: Insufficient documentation

## 2023-12-02 DIAGNOSIS — E282 Polycystic ovarian syndrome: Secondary | ICD-10-CM | POA: Diagnosis not present

## 2023-12-02 DIAGNOSIS — G479 Sleep disorder, unspecified: Secondary | ICD-10-CM | POA: Diagnosis not present

## 2023-12-02 DIAGNOSIS — E559 Vitamin D deficiency, unspecified: Secondary | ICD-10-CM | POA: Diagnosis not present

## 2023-12-02 DIAGNOSIS — E161 Other hypoglycemia: Secondary | ICD-10-CM

## 2023-12-02 MED ORDER — SPIRONOLACTONE 25 MG PO TABS: 25.0000 mg | ORAL_TABLET | Freq: Every day | ORAL | 3 refills | Status: DC

## 2023-12-02 MED ORDER — KETOCONAZOLE 2 % EX CREA: 1.0000 | TOPICAL_CREAM | Freq: Two times a day (BID) | CUTANEOUS | 0 refills | Status: DC

## 2023-12-02 NOTE — Assessment & Plan Note (Addendum)
 Symptoms are stable.  They would like to hold off on sleep study for now.  We can readdress again at future office visit though they will let us  know if they change her mind.

## 2023-12-02 NOTE — Assessment & Plan Note (Signed)
 Noted on last set of labs.  Per mother this has been a persistent issue for many years and she has previously seen endocrinology at [facility] for this.  Most recent A1c was 4.3.  It is possible that this could be contributing some to her fatigue levels.  We did refer her to see endocrinology a few months ago however she prefers to go back to [place].  Will place another referral today.

## 2023-12-02 NOTE — Progress Notes (Signed)
 Elizabeth Cantrell is a 33 y.o. female who presents today for an office visit.  Assessment/Plan:  New/Acute Problems: Rash With appearance consistent with tinea infection.  Will start topical ketoconazole .  Let us  know if not improving.  Chronic Problems Addressed Today: Avitaminosis D She has recently started on vitamin D  supplementation.  Will check next office visit.  PCOS (polycystic ovarian syndrome) Has seen gynecology for this in the past.  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  We will start spironolactone .  It appears she did this several years ago but they do not remember.  We discussed potential side effects.  Follow-up in 3 months.  Sleep trouble Symptoms are stable.  They would like to hold off on sleep study for now.  We can readdress again at future office visit though they will let us  know if they change Elizabeth Cantrell mind.  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  Per mother this has been a persistent issue for many years and she has previously seen endocrinology at [facility] for this.  Most recent A1c was 4.3.  It is possible that this could be contributing some to Elizabeth Cantrell fatigue levels.  We did refer Elizabeth Cantrell to see endocrinology a few months ago however she prefers to go back to [place].  Will place another referral today.  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  Recent labs were all normal including ferritin, TSH, and B12.  We recommend restarting minoxidil.  Due to Elizabeth Cantrell PCOS and hirsutism would also be reasonable to start spironolactone  at this point.  Will start 25 mg daily as above.  We can adjust the dose as tolerated at next office visit.  Preventative Healthcare Tetanus, Diphtheria, and Pertussis (Tdap) vaccine given today.  We did discuss pneumonia vaccine however deferred for today.  We will discuss again next office visit.    Subjective:  HPI:  See A/P for status of chronic  conditions.  Patient is here today for follow-up.I last saw Elizabeth Cantrell 3 months ago.  That time was found to have low vitamin D .  Recommend vitamin D  supplementation.  She also had high insulin  and we referred Elizabeth Cantrell to see endocrinology.  She has not been able to see the endocrinologist as of yet.  She did start vitamin D  supplementation.  She has a few additional concerns that she would like to discuss today as well.  She has had a rash on Elizabeth Cantrell chest intermittently for years.  Comes and goes..  In the summer and worse in the winter.  No specific treatments tried for this.  No itchy.  No pain.  No obvious exposures.  She has also had progressively worsening hair loss on the top of Elizabeth Cantrell scalp for the last several months.  They previously had tried Rogaine without much improvement for this.       Objective:  Physical Exam: BP 102/69   Pulse 93   Temp (!) 97.5 F (36.4 C) (Temporal)   Ht 5' 5.75 (1.67 m)   Wt 140 lb (63.5 kg)   SpO2 97%   BMI 22.77 kg/m   Gen: No acute distress, resting comfortably CV: Regular rate and rhythm with no murmurs appreciated Pulm: Normal work of breathing, clear to auscultation bilaterally with no crackles, wheezes, or rhonchi Skin: Central hair loss also noted on top of scalp.  Several small erythematous well-demarcated lesions approximately 1 to 2 cm in diameter scattered  across chest. Neuro: Grossly normal, moves all extremities Psych: Normal affect and thought content      Hilmer Aliberti M. Kennyth, MD 12/02/2023 10:54 AM

## 2023-12-02 NOTE — Assessment & Plan Note (Signed)
 Has seen gynecology for this in the past.  Currently on metformin  for her hyperinsulinemia.  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  We will start spironolactone .  It appears she did this several years ago but they do not remember.  We discussed potential side effects.  Follow-up in 3 months.

## 2023-12-02 NOTE — Assessment & Plan Note (Signed)
 She has recently started on vitamin D  supplementation.  Will check next office visit.

## 2023-12-02 NOTE — Patient Instructions (Signed)
 It was very nice to see you today!  I will refer you to see the endocrinologist at [facility].  You have a mild fungal infection in your chest.  Start the ketoconazole .  I believe your hair loss is probably due to your PCOS and hormonal imbalances.  We will try spironolactone .  You can also try using minoxidil to help with the hair loss.  Return in about 3 months (around 02/29/2024) for Follow Up.   Take care, Dr Kennyth  PLEASE NOTE:  If you had any lab tests, please let us  know if you have not heard back within a few days. You may see your results on mychart before we have a chance to review them but we will give you a call once they are reviewed by us .   If we ordered any referrals today, please let us  know if you have not heard from their office within the next week.   If you had any urgent prescriptions sent in today, please check with the pharmacy within an hour of our visit to make sure the prescription was transmitted appropriately.   Please try these tips to maintain a healthy lifestyle:  Eat at least 3 REAL meals and 1-2 snacks per day.  Aim for no more than 5 hours between eating.  If you eat breakfast, please do so within one hour of getting up.   Each meal should contain half fruits/vegetables, one quarter protein, and one quarter carbs (no bigger than a computer mouse)  Cut down on sweet beverages. This includes juice, soda, and sweet tea.   Drink at least 1 glass of water with each meal and aim for at least 8 glasses per day  Exercise at least 150 minutes every week.

## 2023-12-02 NOTE — Assessment & Plan Note (Signed)
 Likely related to her PCOS.  Recent labs were all normal including ferritin, TSH, and B12.  We recommend restarting minoxidil.  Due to her PCOS and hirsutism would also be reasonable to start spironolactone  at this point.  Will start 25 mg daily as above.  We can adjust the dose as tolerated at next office visit.

## 2023-12-03 ENCOUNTER — Encounter: Payer: Self-pay | Admitting: Family Medicine

## 2023-12-04 ENCOUNTER — Encounter: Payer: Self-pay | Admitting: Family Medicine

## 2023-12-05 NOTE — Telephone Encounter (Signed)
 Noted.

## 2023-12-05 NOTE — Telephone Encounter (Signed)
 Can we clarify? What does she want genetic testing for?  Elizabeth Cantrell. Daneil Dunker, MD 12/05/2023 2:30 PM

## 2023-12-05 NOTE — Telephone Encounter (Signed)
**Note De-identified  [place] Obfuscation** Please advise 

## 2023-12-07 NOTE — Telephone Encounter (Signed)
Spoke with patient, stated no need for testing  No information needed

## 2023-12-18 ENCOUNTER — Other Ambulatory Visit: Payer: Self-pay | Admitting: Internal Medicine

## 2023-12-19 MED ORDER — METFORMIN HCL ER 750 MG PO TB24: 750.0000 mg | ORAL_TABLET | Freq: Two times a day (BID) | ORAL | 3 refills | Status: AC

## 2023-12-24 DIAGNOSIS — Z419 Encounter for procedure for purposes other than remedying health state, unspecified: Secondary | ICD-10-CM | POA: Diagnosis not present

## 2024-02-04 DIAGNOSIS — Z419 Encounter for procedure for purposes other than remedying health state, unspecified: Secondary | ICD-10-CM | POA: Diagnosis not present

## 2024-03-05 DIAGNOSIS — Z419 Encounter for procedure for purposes other than remedying health state, unspecified: Secondary | ICD-10-CM | POA: Diagnosis not present

## 2024-04-05 DIAGNOSIS — Z419 Encounter for procedure for purposes other than remedying health state, unspecified: Secondary | ICD-10-CM | POA: Diagnosis not present

## 2024-04-16 ENCOUNTER — Encounter: Payer: Self-pay | Admitting: Family Medicine

## 2024-04-17 NOTE — Telephone Encounter (Signed)
 Please schedule a f/u appt per Dr Kennyth last visit

## 2024-04-17 NOTE — Telephone Encounter (Signed)
LVM to schedule 3 mo f/u

## 2024-04-23 ENCOUNTER — Encounter: Payer: Self-pay | Admitting: Family Medicine

## 2024-04-23 ENCOUNTER — Ambulatory Visit (INDEPENDENT_AMBULATORY_CARE_PROVIDER_SITE_OTHER): Admitting: Family Medicine

## 2024-04-23 VITALS — BP 112/77 | HR 82 | Temp 98.1°F | Ht 65.75 in | Wt 143.0 lb

## 2024-04-23 DIAGNOSIS — E161 Other hypoglycemia: Secondary | ICD-10-CM | POA: Diagnosis not present

## 2024-04-23 DIAGNOSIS — L658 Other specified nonscarring hair loss: Secondary | ICD-10-CM

## 2024-04-23 DIAGNOSIS — R5383 Other fatigue: Secondary | ICD-10-CM

## 2024-04-23 DIAGNOSIS — E282 Polycystic ovarian syndrome: Secondary | ICD-10-CM | POA: Diagnosis not present

## 2024-04-23 DIAGNOSIS — H9319 Tinnitus, unspecified ear: Secondary | ICD-10-CM

## 2024-04-23 NOTE — Assessment & Plan Note (Signed)
 Multifactorial in setting of low A1c, hyperinsulinemia, and PCOS.  Also possible OSA.  We have discussed obtaining sleep study to further evaluate however family would like to hold off on this for now.

## 2024-04-23 NOTE — Assessment & Plan Note (Signed)
 Will be referring to endocrinology as above.  We did try spironolactone  however she did not start this.  She is on metformin  750 mg twice daily.

## 2024-04-23 NOTE — Patient Instructions (Signed)
 It was very nice to see you today!  I will refer Elizabeth Cantrell to endocrinology.  Please let us  know if you have not heard them back from them soon.  Will see you back in 3 months.  Please come back sooner if needed.  Return in about 3 months (around 07/24/2024) for Follow Up.   Take care, Dr Kennyth  PLEASE NOTE:  If you had any lab tests, please let us  know if you have not heard back within a few days. You may see your results on mychart before we have a chance to review them but we will give you a call once they are reviewed by us .   If we ordered any referrals today, please let us  know if you have not heard from their office within the next week.   If you had any urgent prescriptions sent in today, please check with the pharmacy within an hour of our visit to make sure the prescription was transmitted appropriately.   Please try these tips to maintain a healthy lifestyle:  Eat at least 3 REAL meals and 1-2 snacks per day.  Aim for no more than 5 hours between eating.  If you eat breakfast, please do so within one hour of getting up.   Each meal should contain half fruits/vegetables, one quarter protein, and one quarter carbs (no bigger than a computer mouse)  Cut down on sweet beverages. This includes juice, soda, and sweet tea.   Drink at least 1 glass of water with each meal and aim for at least 8 glasses per day  Exercise at least 150 minutes every week.

## 2024-04-23 NOTE — Progress Notes (Signed)
   Elizabeth Cantrell is a 33 y.o. female who presents today for an office visit.  Assessment/Plan:  Chronic Problems Addressed Today: Hyperinsulinemia Previously was followed in endocrinology for this.  We did refer her to [facility] a few months ago however they declined the referral.  Most recent A1c was 4.3 with elevated insulin  levels.  It is possible this may be contributing to her fatigue issues as well.  Will place referral for her to see endocrinology locally.  Fatigue Multifactorial in setting of low A1c, hyperinsulinemia, and PCOS.  Also possible OSA.  We have discussed obtaining sleep study to further evaluate however family would like to hold off on this for now.  PCOS (polycystic ovarian syndrome) Will be referring to endocrinology as above.  We did try spironolactone  however she did not start this.  She is on metformin  750 mg twice daily.  Tinnitus She saw ENT for this a few months ago.  Still having persistent symptoms.  They will follow-up with ENT as previously planned.  Reassuring exam today.  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  Recent labs all been normal.  We started spironolactone  at her most recent visit here however she never picked this up.  We did discuss referral to dermatology however they deferred for now.  They will let us  know if they change her mind.     Subjective:  HPI:  See Assessment / plan for status of chronic conditions. She is here with her mother today. Overall is doing well.  She did not start the spironolactone  from last time. Her mother main concern today is that she is still having ongoing issues with fatigue.  We check labs last year that did show hyperinsulinemia and low A1c.  We referred her to endocrinology few months ago however they were told that she cannot be seen there due to not having diabetes.       Objective:  Physical Exam: BP 112/77   Pulse 82   Temp 98.1 F (36.7 C) (Temporal)   Ht 5' 5.75 (1.67 m)   Wt 143 lb (64.9  kg)   SpO2 97%   BMI 23.26 kg/m   Gen: No acute distress, resting comfortably HEENT: TMs clear bilaterally. CV: Regular rate and rhythm with no murmurs appreciated Pulm: Normal work of breathing, clear to auscultation bilaterally with no crackles, wheezes, or rhonchi Neuro: Grossly normal, moves all extremities Psych: Normal affect and thought content      Elizabeth Cantrell M. Kennyth, MD 04/23/2024 1:30 PM

## 2024-04-23 NOTE — Assessment & Plan Note (Signed)
 Previously was followed in endocrinology for this.  We did refer her to [facility] a few months ago however they declined the referral.  Most recent A1c was 4.3 with elevated insulin  levels.  It is possible this may be contributing to her fatigue issues as well.  Will place referral for her to see endocrinology locally.

## 2024-04-23 NOTE — Assessment & Plan Note (Signed)
 Likely related to PCOS.  Recent labs all been normal.  We started spironolactone  at her most recent visit here however she never picked this up.  We did discuss referral to dermatology however they deferred for now.  They will let us  know if they change her mind.

## 2024-04-23 NOTE — Assessment & Plan Note (Signed)
 She saw ENT for this a few months ago.  Still having persistent symptoms.  They will follow-up with ENT as previously planned.  Reassuring exam today.

## 2024-05-05 DIAGNOSIS — Z419 Encounter for procedure for purposes other than remedying health state, unspecified: Secondary | ICD-10-CM | POA: Diagnosis not present

## 2024-06-05 DIAGNOSIS — Z419 Encounter for procedure for purposes other than remedying health state, unspecified: Secondary | ICD-10-CM | POA: Diagnosis not present

## 2024-07-06 DIAGNOSIS — Z419 Encounter for procedure for purposes other than remedying health state, unspecified: Secondary | ICD-10-CM | POA: Diagnosis not present

## 2024-10-10 ENCOUNTER — Encounter: Payer: Self-pay | Admitting: Family Medicine

## 2024-10-19 ENCOUNTER — Telehealth: Payer: Self-pay | Admitting: Family Medicine

## 2024-10-19 NOTE — Telephone Encounter (Signed)
 Lvm to get sche. Transfer to f/o, thanks! //aw

## 2024-10-22 ENCOUNTER — Other Ambulatory Visit: Payer: Self-pay | Admitting: *Deleted

## 2024-10-22 DIAGNOSIS — L658 Other specified nonscarring hair loss: Secondary | ICD-10-CM

## 2024-10-22 DIAGNOSIS — E282 Polycystic ovarian syndrome: Secondary | ICD-10-CM

## 2024-10-22 DIAGNOSIS — E161 Other hypoglycemia: Secondary | ICD-10-CM

## 2024-10-22 NOTE — Telephone Encounter (Signed)
"  Referral placed   "

## 2024-10-22 NOTE — Telephone Encounter (Signed)
 Please advise

## 2024-10-22 NOTE — Telephone Encounter (Signed)
 I don't understand the message. Can we call to clarify what she needs?Ok to place referrals if needed.

## 2024-10-23 ENCOUNTER — Encounter: Payer: Self-pay | Admitting: Family Medicine

## 2024-10-23 DIAGNOSIS — H9319 Tinnitus, unspecified ear: Secondary | ICD-10-CM

## 2024-10-23 NOTE — Telephone Encounter (Signed)
 Got a hold of pt, spoke with pt mother she will be advising daughter to sche her an ov appt when she is available to on [facility].  AW

## 2024-10-24 NOTE — Telephone Encounter (Signed)
 Okay to place referral for ENT?

## 2024-10-26 NOTE — Telephone Encounter (Signed)
"  Ok to place referral?  "

## 2024-10-29 NOTE — Telephone Encounter (Signed)
Referral placed and patient notified via MyChart

## 2024-10-31 NOTE — Telephone Encounter (Signed)
 Noted

## 2024-11-06 ENCOUNTER — Encounter: Payer: Self-pay | Admitting: Family Medicine

## 2024-11-07 NOTE — Telephone Encounter (Signed)
 Duplicated

## 2024-11-07 NOTE — Telephone Encounter (Signed)
 Please schedule an OV with PCP for referral

## 2024-11-07 NOTE — Telephone Encounter (Signed)
 Couldn't call [employer] is down and will cb to get sche. Thanks!

## 2024-11-08 NOTE — Telephone Encounter (Signed)
 Called to lvm aw

## 2024-11-09 ENCOUNTER — Encounter: Payer: Self-pay | Admitting: Family Medicine

## 2024-11-12 ENCOUNTER — Encounter: Payer: Self-pay | Admitting: Family Medicine

## 2024-11-12 NOTE — Telephone Encounter (Signed)
 See note

## 2024-11-13 ENCOUNTER — Ambulatory Visit (INDEPENDENT_AMBULATORY_CARE_PROVIDER_SITE_OTHER): Admitting: Physician Assistant

## 2024-11-13 ENCOUNTER — Encounter (INDEPENDENT_AMBULATORY_CARE_PROVIDER_SITE_OTHER): Payer: Self-pay | Admitting: Physician Assistant

## 2024-11-13 VITALS — BP 122/81 | HR 74 | Temp 97.7°F

## 2024-11-13 DIAGNOSIS — J351 Hypertrophy of tonsils: Secondary | ICD-10-CM

## 2024-11-13 DIAGNOSIS — H9313 Tinnitus, bilateral: Secondary | ICD-10-CM

## 2024-11-13 DIAGNOSIS — R040 Epistaxis: Secondary | ICD-10-CM

## 2024-11-13 DIAGNOSIS — H9312 Tinnitus, left ear: Secondary | ICD-10-CM | POA: Diagnosis not present

## 2024-11-13 NOTE — Telephone Encounter (Signed)
 Ok to place referral

## 2024-11-14 ENCOUNTER — Other Ambulatory Visit: Payer: Self-pay | Admitting: *Deleted

## 2024-11-14 DIAGNOSIS — R261 Paralytic gait: Secondary | ICD-10-CM

## 2024-11-14 NOTE — Progress Notes (Signed)
 Dear Dr. Kennyth, Here is my assessment for our mutual patient, Elizabeth Cantrell. Thank you for allowing me the opportunity to care for your patient. Please do not hesitate to contact me should you have any other questions. Sincerely, Chyrl Cohen PA-C  Otolaryngology Clinic Note Referring provider: Dr. Kennyth HPI:  Elizabeth Cantrell is a 34 y.o. female kindly referred by Dr. Kennyth   Discussed the use of AI scribe software for clinical note transcription with the patient, who gave verbal consent to proceed.  History of Present Illness   Elizabeth Cantrell is a 34 year old female who presents with left-sided tinnitus, recurrent left-sided epistaxis, and tonsillar hypertrophy.  She has persistent left-sided tinnitus that has been stable and unchanged for an extended period. She has not participated in a tinnitus clinic program but previously attended speech therapy. She has inquired about hearing aids, though her hearing is reportedly better than her caregiver's.  She experiences recurrent left-sided epistaxis for over ten years. Approximately ten years ago, she underwent cauterization of a nasal vessel, which significantly reduced the frequency and severity of episodes. Currently, epistaxis occurs approximately twice per month, is less severe than prior to the procedure, and is more common during winter and periods of dryness. The most recent episode occurred last week. She reports nasal and lip dryness and a tendency to pick at scabs in the anterior nasal septum.  She does not snore and does not have frequent throat infections requiring antibiotics. She often feels congested and dry in the throat and is frequently dehydrated due to refusal to drink water. She had a strep throat infection a few years ago, which was treated with medication.           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 Testing for Rett Syndrome normal.   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 No pertinent surgical history.  Family History  Problem Relation Age of Onset   Diabetes Mother    Hypertension Mother    Hyperlipidemia Other        parent grandparent     Social Connections: Socially Isolated (04/20/2024)   Social Connection and Isolation Panel    Frequency of Communication with Friends and Family: Once a week    Frequency of Social Gatherings with Friends and Family: Never    Attends Religious Services: Never    Database Administrator or Organizations: No    Attends Engineer, Structural: Not on file    Marital Status: Never married     Current Medications[1]   Physical Exam:   BP 122/81  Pulse 74   Temp 97.7 F (36.5 C)   SpO2 99%   Pertinent Findings  CN II-XII grossly intact Bilateral EAC clear and TM intact with well pneumatized middle ear spaces Anterior rhinoscopy: Septum midline, left excoriation anterior septum, no bleeding; bilateral inferior turbinates with no hypertrophy  No lesions of oral cavity/oropharynx; dentition wnl, tonsils 3+ No obviously palpable neck masses/lymphadenopathy/thyromegaly No respiratory distress or stridor       Seprately Identifiable Procedures:  None  Impression & Plans:  Elizabeth Cantrell is a 34 y.o.  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 No significant hearing loss. Management is supportive. - We did discuss this previously  - Provided information about [facility] for further recommendations.  Recurrent epistaxis Recurrent left-sided epistaxis, previously treated with cauterization. Current episodes infrequent, less severe, linked to nasal dryness and anterior septal scabbing from picking. No vascular lesion. - Recommended Vaseline application to anterior nasal septum for moisture. - Advised saline nasal spray and humidifier use to reduce dryness. - Instructed to avoid inserting objects into nose and apply ointment only to anterior aspect. - Advised to return if epistaxis frequency or severity increases.  Tonsillar hypertrophy Bilateral tonsillar hypertrophy without recurrent infections or sleep-disordered breathing. No indication for tonsillectomy. - No intervention required.           - f/u PRN   Thank you for allowing me the opportunity to care for your patient. Please do not hesitate to contact me should you have any other questions.  Sincerely, Chyrl Cohen PA-C [facility] Phone: [phone] Fax: [phone]  11/14/2024, 10:28 AM        [1]  Current Outpatient Medications:    aluminum chloride (DRYSOL) 20 % external solution, Apply topically at bedtime., Disp: 60 mL, Rfl: 0   bifidobacterium infantis (ALIGN) capsule, Take 1 capsule by mouth daily., Disp: 30 capsule, Rfl: 5   metFORMIN  (GLUCOPHAGE -XR) 750 MG 24 hr tablet, Take 1 tablet (750 mg total) by mouth 2 (two) times daily., Disp: 180 tablet, Rfl: 3   Methylcobalamin (B12-ACTIVE PO), Take by mouth., Disp: , Rfl:    Probiotic Product (ALIGN PO), Take 1 capsule by mouth daily., Disp: , Rfl:

## 2024-11-14 NOTE — Telephone Encounter (Signed)
 Referral orthopedic send

## 2025-02-07 ENCOUNTER — Encounter: Admitting: Family Medicine
